# Patient Record
Sex: Female | Born: 1995 | Race: Black or African American | Hispanic: No | Marital: Single | Smoking: Never smoker
Health system: Southern US, Community
[De-identification: ages and names within clinical notes are randomized; demographics above are authoritative.]

## PROBLEM LIST (undated history)

## (undated) DIAGNOSIS — R635 Abnormal weight gain: Secondary | ICD-10-CM

## (undated) DIAGNOSIS — A749 Chlamydial infection, unspecified: Secondary | ICD-10-CM

## (undated) DIAGNOSIS — R87810 Cervical high risk human papillomavirus (HPV) DNA test positive: Secondary | ICD-10-CM

## (undated) DIAGNOSIS — N920 Excessive and frequent menstruation with regular cycle: Secondary | ICD-10-CM

## (undated) DIAGNOSIS — N898 Other specified noninflammatory disorders of vagina: Secondary | ICD-10-CM

## (undated) DIAGNOSIS — Z309 Encounter for contraceptive management, unspecified: Secondary | ICD-10-CM

## (undated) DIAGNOSIS — N760 Acute vaginitis: Secondary | ICD-10-CM

## (undated) DIAGNOSIS — B9689 Other specified bacterial agents as the cause of diseases classified elsewhere: Secondary | ICD-10-CM

## (undated) HISTORY — DX: Acute vaginitis: N76.0

## (undated) HISTORY — DX: Encounter for contraceptive management, unspecified: Z30.9

## (undated) HISTORY — PX: NO PAST SURGERIES: SHX2092

## (undated) HISTORY — DX: Chlamydial infection, unspecified: A74.9

## (undated) HISTORY — DX: Other specified noninflammatory disorders of vagina: N89.8

## (undated) HISTORY — DX: Other specified bacterial agents as the cause of diseases classified elsewhere: B96.89

## (undated) HISTORY — DX: Excessive and frequent menstruation with regular cycle: N92.0

## (undated) HISTORY — DX: Abnormal weight gain: R63.5

---
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
## 2014-07-03 ENCOUNTER — Encounter (HOSPITAL_COMMUNITY): Payer: Self-pay | Admitting: Emergency Medicine

## 2014-07-03 DIAGNOSIS — N939 Abnormal uterine and vaginal bleeding, unspecified: Secondary | ICD-10-CM | POA: Insufficient documentation

## 2014-07-03 DIAGNOSIS — Z3202 Encounter for pregnancy test, result negative: Secondary | ICD-10-CM | POA: Insufficient documentation

## 2014-07-03 DIAGNOSIS — Z791 Long term (current) use of non-steroidal anti-inflammatories (NSAID): Secondary | ICD-10-CM | POA: Insufficient documentation

## 2014-07-03 NOTE — ED Notes (Signed)
Patient states she has been having her menstrual period x 2 months and it hasn't stopped.

## 2014-07-04 ENCOUNTER — Emergency Department (HOSPITAL_COMMUNITY)
Admission: EM | Admit: 2014-07-04 | Discharge: 2014-07-04 | Disposition: A | Discharge status: Home / Self Care | Payer: Medicaid Other | Attending: Emergency Medicine | Admitting: Emergency Medicine

## 2014-07-04 DIAGNOSIS — N939 Abnormal uterine and vaginal bleeding, unspecified: Secondary | ICD-10-CM

## 2014-07-04 LAB — URINALYSIS, ROUTINE W REFLEX MICROSCOPIC
BILIRUBIN URINE: NEGATIVE
Glucose, UA: NEGATIVE mg/dL
Hgb urine dipstick: NEGATIVE
KETONES UR: NEGATIVE mg/dL
LEUKOCYTES UA: NEGATIVE
NITRITE: NEGATIVE
Protein, ur: NEGATIVE mg/dL
UROBILINOGEN UA: 0.2 mg/dL (ref 0.0–1.0)
pH: 5.5 (ref 5.0–8.0)

## 2014-07-04 LAB — PREGNANCY, URINE: Preg Test, Ur: NEGATIVE

## 2014-07-04 MED ORDER — NORGESTIMATE-ETH ESTRADIOL 0.25-35 MG-MCG PO TABS: 1.0000 | ORAL_TABLET | Freq: Every morning | ORAL | Status: DC

## 2014-07-04 NOTE — Discharge Instructions (Signed)
Abnormal Uterine Bleeding Abnormal uterine bleeding can affect women at various stages in life, including teenagers, women in their reproductive years, pregnant women, and women who have reached menopause. Several kinds of uterine bleeding are considered abnormal, including:  Bleeding or spotting between periods.   Bleeding after sexual intercourse.   Bleeding that is heavier or more than normal.   Periods that last longer than usual.  Bleeding after menopause.  Many cases of abnormal uterine bleeding are minor and simple to treat, while others are more serious. Any type of abnormal bleeding should be evaluated by your health care provider. Treatment will depend on the cause of the bleeding. HOME CARE INSTRUCTIONS Monitor your condition for any changes. The following actions may help to alleviate any discomfort you are experiencing:  Avoid the use of tampons and douches as directed by your health care provider.  Change your pads frequently. You should get regular pelvic exams and Pap tests. Keep all follow-up appointments for diagnostic tests as directed by your health care provider.  SEEK MEDICAL CARE IF:   Your bleeding lasts more than 1 week.   You feel dizzy at times.  SEEK IMMEDIATE MEDICAL CARE IF:   You pass out.   You are changing pads every 15 to 30 minutes.   You have abdominal pain.  You have a fever.   You become sweaty or weak.   You are passing large blood clots from the vagina.   You start to feel nauseous and vomit. MAKE SURE YOU:   Understand these instructions.  Will watch your condition.  Will get help right away if you are not doing well or get worse. Document Released: 09/14/2005 Document Revised: 09/19/2013 Document Reviewed: 04/13/2013 [employer] Patient Information 2015 [employer]. This information is not intended to replace advice given to you by your health care provider. Make sure you discuss any questions you have with your  health care provider.  

## 2014-07-04 NOTE — ED Provider Notes (Signed)
CSN: [id number]     Arrival date & time 07/03/14  2331 History   First MD Initiated Contact with Patient 07/04/14 0133     Chief Complaint  Patient presents with  . Menstrual Problem     (Consider location/radiation/quality/duration/timing/severity/associated sxs/prior Treatment) HPI  Stacy Mayo is a 18 y.o. female presents for her daily vaginal bleeding since July 2015. Bleeding is sometimes heavy and sometimes light. She denies fever, chills, nausea, vomiting, weakness, or dizziness. She stopped taking oral contraceptives several months ago. There are no other known modifying factors.   History reviewed. No pertinent past medical history. History reviewed. No pertinent past surgical history. No family history on file. History  Substance Use Topics  . Smoking status: Never Smoker   . Smokeless tobacco: Not on file  . Alcohol Use: No   OB History   Grav Para Term Preterm Abortions TAB SAB Ect Mult Living                 Review of Systems  All other systems reviewed and are negative.     Allergies  Review of patient's allergies indicates no known allergies.  Home Medications   Prior to Admission medications   Medication Sig Start Date End Date Taking? Authorizing Provider  meloxicam (MOBIC) 7.5 MG tablet 1 po bid with food 06/29/12   Kathie DikeHobson M Bryant, PA-C  methocarbamol (ROBAXIN) 500 MG tablet 1 or 2 po tid for spasm/pain 06/29/12   Kathie DikeHobson M Bryant, PA-C  norgestimate-ethinyl estradiol (SPRINTEC 28) 0.25-35 MG-MCG tablet Take 1 tablet by mouth every morning.    Historical Provider, MD   BP 100/72  Pulse 64  Temp(Src) 98.2 F (36.8 C) (Oral)  Resp 18  Ht 5\' 3"  (1.6 m)  Wt 152 lb (68.947 kg)  BMI 26.93 kg/m2  SpO2 100%  LMP 05/03/2014 Physical Exam  Nursing note and vitals reviewed. Constitutional: She is oriented to person, place, and time. She appears well-developed and well-nourished.  HENT:  Head: Normocephalic and atraumatic.  Eyes: Conjunctivae and  EOM are normal. Pupils are equal, round, and reactive to light.  Neck: Normal range of motion and phonation normal. Neck supple.  Cardiovascular: Normal rate and regular rhythm.   Pulmonary/Chest: Effort normal and breath sounds normal. She exhibits no tenderness.  Abdominal: Soft. She exhibits no distension. There is no tenderness. There is no guarding.  Musculoskeletal: Normal range of motion.  Neurological: She is alert and oriented to person, place, and time. She exhibits normal muscle tone.  Skin: Skin is warm and dry.  Psychiatric: She has a normal mood and affect. Her behavior is normal. Judgment and thought content normal.    ED Course  Procedures (including critical care time)   Findings discussed with the patient, and mother, all questions answered.  Labs Review Labs Reviewed  URINALYSIS, ROUTINE W REFLEX MICROSCOPIC - Abnormal; Notable for the following:    Specific Gravity, Urine >1.030 (*)    All other components within normal limits  PREGNANCY, URINE    Imaging Review No results found.   EKG Interpretation None      MDM   Final diagnoses:  Abnormal vaginal bleeding    Persistent, vaginal bleeding for several months without signs and symptoms of hypovolemia. No apparent infection, or pregnancy.  Nursing Notes Reviewed/ Care Coordinated Applicable Imaging Reviewed Interpretation of Laboratory Data incorporated into ED treatment  The patient appears reasonably screened and/or stabilized for discharge and I doubt any other medical condition or other [facility] requiring further  screening, evaluation, or treatment in the ED at this time prior to discharge.  Plan: Home Medications- restart OC; Home Treatments- rest; return here if the recommended treatment, does not improve the symptoms; Recommended follow up- GYN 3 weeks for check up    Flint Melter, MD 07/04/14 0206

## 2014-08-06 ENCOUNTER — Ambulatory Visit: Payer: Self-pay | Admitting: Obstetrics and Gynecology

## 2014-08-15 ENCOUNTER — Ambulatory Visit: Payer: Self-pay | Admitting: Obstetrics and Gynecology

## 2014-08-17 ENCOUNTER — Encounter: Payer: Self-pay | Admitting: Obstetrics and Gynecology

## 2014-08-17 ENCOUNTER — Ambulatory Visit: Payer: Self-pay | Admitting: Obstetrics and Gynecology

## 2014-10-11 ENCOUNTER — Encounter: Payer: Self-pay | Admitting: Adult Health

## 2014-10-11 ENCOUNTER — Ambulatory Visit (INDEPENDENT_AMBULATORY_CARE_PROVIDER_SITE_OTHER): Payer: BLUE CROSS/BLUE SHIELD | Admitting: Adult Health

## 2014-10-11 VITALS — BP 100/68 | Ht 65.5 in | Wt 178.0 lb

## 2014-10-11 DIAGNOSIS — A499 Bacterial infection, unspecified: Secondary | ICD-10-CM

## 2014-10-11 DIAGNOSIS — N920 Excessive and frequent menstruation with regular cycle: Secondary | ICD-10-CM

## 2014-10-11 DIAGNOSIS — R635 Abnormal weight gain: Secondary | ICD-10-CM | POA: Insufficient documentation

## 2014-10-11 DIAGNOSIS — B9689 Other specified bacterial agents as the cause of diseases classified elsewhere: Secondary | ICD-10-CM

## 2014-10-11 DIAGNOSIS — N760 Acute vaginitis: Secondary | ICD-10-CM | POA: Insufficient documentation

## 2014-10-11 DIAGNOSIS — Z309 Encounter for contraceptive management, unspecified: Secondary | ICD-10-CM | POA: Insufficient documentation

## 2014-10-11 DIAGNOSIS — Z3040 Encounter for surveillance of contraceptives, unspecified: Secondary | ICD-10-CM

## 2014-10-11 DIAGNOSIS — N898 Other specified noninflammatory disorders of vagina: Secondary | ICD-10-CM

## 2014-10-11 HISTORY — DX: Other specified noninflammatory disorders of vagina: N89.8

## 2014-10-11 HISTORY — DX: Excessive and frequent menstruation with regular cycle: N92.0

## 2014-10-11 HISTORY — DX: Other specified bacterial agents as the cause of diseases classified elsewhere: B96.89

## 2014-10-11 HISTORY — DX: Abnormal weight gain: R63.5

## 2014-10-11 HISTORY — DX: Encounter for contraceptive management, unspecified: Z30.9

## 2014-10-11 LAB — POCT WET PREP (WET MOUNT): WBC WET PREP: POSITIVE

## 2014-10-11 LAB — COMPREHENSIVE METABOLIC PANEL
ALBUMIN: 4.1 g/dL (ref 3.5–5.2)
ALK PHOS: 40 U/L (ref 39–117)
ALT: <8 U/L (ref 0–35)
AST: 14 U/L (ref 0–37)
BUN: 11 mg/dL (ref 6–23)
CO2: 22 mEq/L (ref 19–32)
Calcium: 9.2 mg/dL (ref 8.4–10.5)
Chloride: 106 mEq/L (ref 96–112)
Creat: 0.66 mg/dL (ref 0.50–1.10)
Glucose, Bld: 71 mg/dL (ref 70–99)
POTASSIUM: 4 meq/L (ref 3.5–5.3)
Sodium: 139 mEq/L (ref 135–145)
TOTAL PROTEIN: 7.4 g/dL (ref 6.0–8.3)
Total Bilirubin: 0.3 mg/dL (ref 0.2–1.2)

## 2014-10-11 LAB — CBC
HCT: 36.2 % (ref 36.0–46.0)
Hemoglobin: 12.1 g/dL (ref 12.0–15.0)
MCH: 29.4 pg (ref 26.0–34.0)
MCHC: 33.4 g/dL (ref 30.0–36.0)
MCV: 88.1 fL (ref 78.0–100.0)
MPV: 11 fL (ref 8.6–12.4)
PLATELETS: 292 10*3/uL (ref 150–400)
RBC: 4.11 MIL/uL (ref 3.87–5.11)
RDW: 13.5 % (ref 11.5–15.5)
WBC: 4.5 10*3/uL (ref 4.0–10.5)

## 2014-10-11 MED ORDER — METRONIDAZOLE 500 MG PO TABS: 500.0000 mg | ORAL_TABLET | Freq: Two times a day (BID) | ORAL | Status: DC

## 2014-10-11 NOTE — Progress Notes (Signed)
Subjective:     Patient ID: Stacy Mayo, female   DOB: 1996/07/30, 19 y.o.   MRN: [id number]  HPI Stacy Mayo is a 19 year old black female in complaining of vaginal discharge with odor and heavy periods even with sprintec and weight gain.Periods last 7 days and heavy the whole time.Cramps are better with the pill.Has gained about 25 lbs since October.Periods regular, no dry skin or hair and nail changes.Saw Dr Emelda FearFerguson about 2 years ago.No complaints with sex.  Review of Systems See HPI Reviewed past medical,surgical, social and family history. Reviewed medications and allergies.     Objective:   Physical Exam BP 100/68 mmHg  Ht 5' 5.5" (1.664 m)  Wt 178 lb (80.74 kg)  BMI 29.16 kg/m2  LMP 09/27/2014   Skin warm and dry.Pelvic: external genitalia is normal in appearance, vagina: white discharge with odor, cervix:smooth and bulbous, uterus: normal size, shape and contour, non tender, no masses felt, adnexa: no masses or tenderness noted. Wet prep: + for clue cells and +WBCs. GC/CHL obtained. Thyroid feels WNL.  Assessment:    Menorrhagia Vaginal discharge BV Contraceptive management Weight gain     Plan:    Continue sprintec Check GC/CHL and CBC,CMP and TSH Return in 1 week for US and see me   Rx flagyl 500 mg 1 bid x 7 days, no alcohol, review handout on BV   Review handout on menorrhagia

## 2014-10-11 NOTE — Patient Instructions (Signed)
Menorrhagia Menorrhagia is the medical term for when your menstrual periods are heavy or last longer than usual. With menorrhagia, every period you have may cause enough blood loss and cramping that you are unable to maintain your usual activities. CAUSES  In some cases, the cause of heavy periods is unknown, but a number of conditions may cause menorrhagia. Common causes include:  A problem with the hormone-producing thyroid gland (hypothyroid).  Noncancerous growths in the uterus (polyps or fibroids).  An imbalance of the estrogen and progesterone hormones.  One of your ovaries not releasing an egg during one or more months.  Side effects of having an intrauterine device (IUD).  Side effects of some medicines, such as anti-inflammatory medicines or blood thinners.  A bleeding disorder that stops your blood from clotting normally. SIGNS AND SYMPTOMS  During a normal period, bleeding lasts between 4 and 8 days. Signs that your periods are too heavy include:  You routinely have to change your pad or tampon every 1 or 2 hours because it is completely soaked.  You pass blood clots larger than 1 inch (2.5 cm) in size.  You have bleeding for more than 7 days.  You need to use pads and tampons at the same time because of heavy bleeding.  You need to wake up to change your pads or tampons during the night.  You have symptoms of anemia, such as tiredness, fatigue, or shortness of breath. DIAGNOSIS  Your health care provider will perform a physical exam and ask you questions about your symptoms and menstrual history. Other tests may be ordered based on what the health care provider finds during the exam. These tests can include:  Blood tests. Blood tests are used to check if you are pregnant or have hormonal changes, a bleeding or thyroid disorder, low iron levels (anemia), or other problems.  Endometrial biopsy. Your health care provider takes a sample of tissue from the inside of your  uterus to be examined under a microscope.  Pelvic ultrasound. This test uses sound waves to make a picture of your uterus, ovaries, and vagina. The pictures can show if you have fibroids or other growths.  Hysteroscopy. For this test, your health care provider will use a small telescope to look inside your uterus. Based on the results of your initial tests, your health care provider may recommend further testing. TREATMENT  Treatment may not be needed. If it is needed, your health care provider may recommend treatment with one or more medicines first. If these do not reduce bleeding enough, a surgical treatment might be an option. The best treatment for you will depend on:   Whether you need to prevent pregnancy.  Your desire to have children in the future.  The cause and severity of your bleeding.  Your opinion and personal preference.  Medicines for menorrhagia may include:  Birth control methods that use hormones. These include the pill, skin patch, vaginal ring, shots that you get every 3 months, hormonal IUD, and implant. These treatments reduce bleeding during your menstrual period.  Medicines that thicken blood and slow bleeding.  Medicines that reduce swelling, such as ibuprofen.  Medicines that contain a synthetic hormone called progestin.   Medicines that make the ovaries stop working for a short time.  You may need surgical treatment for menorrhagia if the medicines are unsuccessful. Treatment options include:  Dilation and curettage (D&C). In this procedure, your health care provider opens (dilates) your cervix and then scrapes or suctions tissue from   the lining of your uterus to reduce menstrual bleeding.  Operative hysteroscopy. This procedure uses a tiny tube with a light (hysteroscope) to view your uterine cavity and can help in the surgical removal of a polyp that may be causing heavy periods.  Endometrial ablation. Through various techniques, your health care  provider permanently destroys the entire lining of your uterus (endometrium). After endometrial ablation, most women have little or no menstrual flow. Endometrial ablation reduces your ability to become pregnant.  Endometrial resection. This surgical procedure uses an electrosurgical wire loop to remove the lining of the uterus. This procedure also reduces your ability to become pregnant.  Hysterectomy. Surgical removal of the uterus and cervix is a permanent procedure that stops menstrual periods. Pregnancy is not possible after a hysterectomy. This procedure requires anesthesia and hospitalization. HOME CARE INSTRUCTIONS   Only take over-the-counter or prescription medicines as directed by your health care provider. Take prescribed medicines exactly as directed. Do not change or switch medicines without consulting your health care provider.  Take any prescribed iron pills exactly as directed by your health care provider. Long-term heavy bleeding may result in low iron levels. Iron pills help replace the iron your body lost from heavy bleeding. Iron may cause constipation. If this becomes a problem, increase the bran, fruits, and roughage in your diet.  Do not take aspirin or medicines that contain aspirin 1 week before or during your menstrual period. Aspirin may make the bleeding worse.  If you need to change your sanitary pad or tampon more than once every 2 hours, stay in bed and rest as much as possible until the bleeding stops.  Eat well-balanced meals. Eat foods high in iron. Examples are leafy green vegetables, meat, liver, eggs, and whole grain breads and cereals. Do not try to lose weight until the abnormal bleeding has stopped and your blood iron level is back to normal. SEEK MEDICAL CARE IF:   You soak through a pad or tampon every 1 or 2 hours, and this happens every time you have a period.  You need to use pads and tampons at the same time because you are bleeding so much.  You  need to change your pad or tampon during the night.  You have a period that lasts for more than 8 days.  You pass clots bigger than 1 inch wide.  You have irregular periods that happen more or less often than once a month.  You feel dizzy or faint.  You feel very weak or tired.  You feel short of breath or feel your heart is beating too fast when you exercise.  You have nausea and vomiting or diarrhea while you are taking your medicine.  You have any problems that may be related to the medicine you are taking. SEEK IMMEDIATE MEDICAL CARE IF:   You soak through 4 or more pads or tampons in 2 hours.  You have any bleeding while you are pregnant. MAKE SURE YOU:   Understand these instructions.  Will watch your condition.  Will get help right away if you are not doing well or get worse. Document Released: 09/14/2005 Document Revised: 09/19/2013 Document Reviewed: 03/05/2013 [facility] Patient Information 2015 [place], [place]. This information is not intended to replace advice given to you by your health care provider. Make sure you discuss any questions you have with your health care provider. Bacterial Vaginosis Bacterial vaginosis is a vaginal infection that occurs when the normal balance of bacteria in the vagina is disrupted.  It results from an overgrowth of certain bacteria. This is the most common vaginal infection in women of childbearing age. Treatment is important to prevent complications, especially in pregnant women, as it can cause a premature delivery. CAUSES  Bacterial vaginosis is caused by an increase in harmful bacteria that are normally present in smaller amounts in the vagina. Several different kinds of bacteria can cause bacterial vaginosis. However, the reason that the condition develops is not fully understood. RISK FACTORS Certain activities or behaviors can put you at an increased risk of developing bacterial vaginosis, including:  Having a new sex partner or  multiple sex partners.  Douching.  Using an intrauterine device (IUD) for contraception. Women do not get bacterial vaginosis from toilet seats, bedding, swimming pools, or contact with objects around them. SIGNS AND SYMPTOMS  Some women with bacterial vaginosis have no signs or symptoms. Common symptoms include:  Grey vaginal discharge.  A fishlike odor with discharge, especially after sexual intercourse.  Itching or burning of the vagina and vulva.  Burning or pain with urination. DIAGNOSIS  Your health care provider will take a medical history and examine the vagina for signs of bacterial vaginosis. A sample of vaginal fluid may be taken. Your health care provider will look at this sample under a microscope to check for bacteria and abnormal cells. A vaginal pH test may also be done.  TREATMENT  Bacterial vaginosis may be treated with antibiotic medicines. These may be given in the form of a pill or a vaginal cream. A second round of antibiotics may be prescribed if the condition comes back after treatment.  HOME CARE INSTRUCTIONS   Only take over-the-counter or prescription medicines as directed by your health care provider.  If antibiotic medicine was prescribed, take it as directed. Make sure you finish it even if you start to feel better.  Do not have sex until treatment is completed.  Tell all sexual partners that you have a vaginal infection. They should see their health care provider and be treated if they have problems, such as a mild rash or itching.  Practice safe sex by using condoms and only having one sex partner. SEEK MEDICAL CARE IF:   Your symptoms are not improving after 3 days of treatment.  You have increased discharge or pain.  You have a fever. MAKE SURE YOU:   Understand these instructions.  Will watch your condition.  Will get help right away if you are not doing well or get worse. FOR MORE INFORMATION  Centers for Disease Control and  Prevention, Division of STD Prevention: SolutionApps.co.zawww.cdc.gov/std American Sexual Health Association (ASHA): www.ashastd.org  Document Released: 09/14/2005 Document Revised: 07/05/2013 Document Reviewed: 04/26/2013 [facility] Patient Information 2015 [place]. This information is not intended to replace advice given to you by your health care provider. Make sure you discuss any questions you have with your health care provider. Return in 1 week for [place]

## 2014-10-12 LAB — GC/CHLAMYDIA PROBE AMP
CT Probe RNA: NEGATIVE
GC Probe RNA: NEGATIVE

## 2014-10-12 LAB — TSH: TSH: 1.389 u[IU]/mL (ref 0.350–4.500)

## 2014-10-15 ENCOUNTER — Telehealth: Payer: Self-pay | Admitting: Adult Health

## 2014-10-15 NOTE — Telephone Encounter (Signed)
Pt aware labs good and GC/CHL negative, will see 1/25 for [place]

## 2014-10-22 ENCOUNTER — Ambulatory Visit (INDEPENDENT_AMBULATORY_CARE_PROVIDER_SITE_OTHER): Payer: BLUE CROSS/BLUE SHIELD | Admitting: Adult Health

## 2014-10-22 ENCOUNTER — Ambulatory Visit (INDEPENDENT_AMBULATORY_CARE_PROVIDER_SITE_OTHER): Payer: BLUE CROSS/BLUE SHIELD

## 2014-10-22 ENCOUNTER — Other Ambulatory Visit: Payer: Self-pay | Admitting: Adult Health

## 2014-10-22 ENCOUNTER — Encounter: Payer: Self-pay | Admitting: Adult Health

## 2014-10-22 VITALS — BP 118/80 | Ht 65.0 in | Wt 178.0 lb

## 2014-10-22 DIAGNOSIS — N920 Excessive and frequent menstruation with regular cycle: Secondary | ICD-10-CM

## 2014-10-22 NOTE — Patient Instructions (Signed)
Continue OCs  Return in May for pap and physical

## 2014-10-22 NOTE — Progress Notes (Signed)
Subjective:     Patient ID: Stacy Mayo, female   DOB: 13-Apr-1996, 19 y.o.   MRN: [id number]  HPI Stacy Mayo is a 19 year old black female in for US.  Review of Systems See HPI Reviewed past medical,surgical, social and family history. Reviewed medications and allergies.     Objective:   Physical Exam BP 118/80 mmHg  Ht 5\' 5"  (1.651 m)  Wt 178 lb (80.74 kg)  BMI 29.62 kg/m2  LMP 12/31/2015Reviewed US with pt.   Uterus 6.4 x 5.3 x 4.2 cm, anteverted  Endometrium 4.0 mm, symmetrical,   Right ovary 3.8 x 2.2 x 1.9 cm,   Left ovary 4.2 x 2.7 x 1.3 cm,   Trace to a Small amount of free fluid noted within the posterior cul-de-sac  Technician Comments:  Anteverted uterus, Endom-4.610mm symmetrical, bilateral adnexa appears WNL, small amount of free fluid noted within the posterior cul-de-sac Will continue sprintec for now,as cramps are better and periods regular.  Assessment:     Menorrhagia     Plan:     Continue OCs Return in May for pap and physical

## 2014-10-27 ENCOUNTER — Encounter (HOSPITAL_COMMUNITY): Payer: Self-pay | Admitting: *Deleted

## 2014-10-27 ENCOUNTER — Emergency Department (HOSPITAL_COMMUNITY)
Admission: EM | Admit: 2014-10-27 | Discharge: 2014-10-27 | Disposition: A | Discharge status: Home / Self Care | Payer: BLUE CROSS/BLUE SHIELD | Attending: Emergency Medicine | Admitting: Emergency Medicine

## 2014-10-27 ENCOUNTER — Emergency Department (HOSPITAL_COMMUNITY): Payer: BLUE CROSS/BLUE SHIELD

## 2014-10-27 DIAGNOSIS — W000XXA Fall on same level due to ice and snow, initial encounter: Secondary | ICD-10-CM | POA: Insufficient documentation

## 2014-10-27 DIAGNOSIS — Z793 Long term (current) use of hormonal contraceptives: Secondary | ICD-10-CM | POA: Insufficient documentation

## 2014-10-27 DIAGNOSIS — W1839XA Other fall on same level, initial encounter: Secondary | ICD-10-CM | POA: Diagnosis not present

## 2014-10-27 DIAGNOSIS — N920 Excessive and frequent menstruation with regular cycle: Secondary | ICD-10-CM | POA: Diagnosis not present

## 2014-10-27 DIAGNOSIS — Y998 Other external cause status: Secondary | ICD-10-CM | POA: Diagnosis not present

## 2014-10-27 DIAGNOSIS — S99922A Unspecified injury of left foot, initial encounter: Secondary | ICD-10-CM | POA: Diagnosis present

## 2014-10-27 DIAGNOSIS — S93502A Unspecified sprain of left great toe, initial encounter: Secondary | ICD-10-CM | POA: Insufficient documentation

## 2014-10-27 DIAGNOSIS — S93509A Unspecified sprain of unspecified toe(s), initial encounter: Secondary | ICD-10-CM

## 2014-10-27 DIAGNOSIS — Y9389 Activity, other specified: Secondary | ICD-10-CM | POA: Insufficient documentation

## 2014-10-27 DIAGNOSIS — Y9289 Other specified places as the place of occurrence of the external cause: Secondary | ICD-10-CM | POA: Diagnosis not present

## 2014-10-27 MED ORDER — IBUPROFEN 800 MG PO TABS
800.0000 mg | ORAL_TABLET | Freq: Once | ORAL | Status: AC
  Administered 2014-10-27: 800 mg via ORAL
  Filled 2014-10-27: qty 1

## 2014-10-27 MED ORDER — HYDROCODONE-ACETAMINOPHEN 5-325 MG PO TABS: 1.0000 | ORAL_TABLET | ORAL | Status: DC | PRN

## 2014-10-27 MED ORDER — HYDROCODONE-ACETAMINOPHEN 5-325 MG PO TABS
1.0000 | ORAL_TABLET | Freq: Once | ORAL | Status: DC
  Filled 2014-10-27: qty 1

## 2014-10-27 MED ORDER — IBUPROFEN 800 MG PO TABS: 800.0000 mg | ORAL_TABLET | Freq: Three times a day (TID) | ORAL | Status: DC

## 2014-10-27 NOTE — Discharge Instructions (Signed)
Joint Sprain °A sprain is a tear or stretch in the ligaments that hold a joint together. Severe sprains may need as long as 3-6 weeks of immobilization and/or exercises to heal completely. Sprained joints should be rested and protected. If not, they can become unstable and prone to re-injury. Proper treatment can reduce your pain, shorten the period of disability, and reduce the risk of repeated injuries. °TREATMENT  °· Rest and elevate the injured joint to reduce pain and swelling. °· Apply ice packs to the injury for 20-30 minutes every 2-3 hours for the next 2-3 days. °· Keep the injury wrapped in a compression bandage or splint as long as the joint is painful or as instructed by your caregiver. °· Do not use the injured joint until it is completely healed to prevent re-injury and chronic instability. Follow the instructions of your caregiver. °· Long-term sprain management may require exercises and/or treatment by a physical therapist. Taping or special braces may help stabilize the joint until it is completely better. °SEEK MEDICAL CARE IF:  °· You develop increased pain or swelling of the joint. °· You develop increasing redness and warmth of the joint. °· You develop a fever. °· It becomes stiff. °· Your hand or foot gets cold or numb. °Document Released: 10/22/2004 Document Revised: 12/07/2011 Document Reviewed: 10/01/2008 °ExitCare® Patient Information ©2015 [employer]. This information is not intended to replace advice given to you by your health care provider. Make sure you discuss any questions you have with your health care provider. ° °

## 2014-10-27 NOTE — ED Provider Notes (Signed)
CSN: [id number]     Arrival date & time 10/27/14  1910 History   First MD Initiated Contact with Patient 10/27/14 2001     Chief Complaint  Patient presents with  . Foot Pain     (Consider location/radiation/quality/duration/timing/severity/associated sxs/prior Treatment) HPI Comments: Patient is a 19 year old female who states that on Thursday, January 28 she sustained a fall on icy surface and injured her left foot. She has been having pain since that time. She has swelling present. She is able to ambulate but with a great nail pain. His been no previous operations or procedures involving the left foot. The pain is worse with standing or walking. The pain is better with rest.  Patient is a 19 y.o. female presenting with lower extremity pain. The history is provided by the patient.  Foot Pain This is a new problem. Pertinent negatives include no abdominal pain, arthralgias, chest pain, coughing or neck pain.    Past Medical History  Diagnosis Date  . Menorrhagia with regular cycle 10/11/2014  . Vaginal discharge 10/11/2014  . BV (bacterial vaginosis) 10/11/2014  . Contraceptive management 10/11/2014  . Weight gain 10/11/2014   Past Surgical History  Procedure Laterality Date  . No past surgeries     Family History  Problem Relation Age of Onset  . Hypertension Maternal Grandmother   . Diabetes Maternal Grandfather    History  Substance Use Topics  . Smoking status: Never Smoker   . Smokeless tobacco: Never Used  . Alcohol Use: No   OB History    Gravida Para Term Preterm AB TAB SAB Ectopic Multiple Living       Review of Systems  Constitutional: Negative for activity change.       All ROS Neg except as noted in HPI  HENT: Negative.   Eyes: Negative for photophobia and discharge.  Respiratory: Negative for cough, shortness of breath and wheezing.   Cardiovascular: Negative for chest pain and palpitations.  Gastrointestinal: Negative for abdominal  pain and blood in stool.  Genitourinary: Negative for dysuria, frequency and hematuria.  Musculoskeletal: Negative for back pain, arthralgias and neck pain.  Skin: Negative.   Neurological: Negative for dizziness, seizures and speech difficulty.  Psychiatric/Behavioral: Negative for hallucinations and confusion.      Allergies  Review of patient's allergies indicates no known allergies.  Home Medications   Prior to Admission medications   Medication Sig Start Date End Date Taking? Authorizing Provider  metroNIDAZOLE (FLAGYL) 500 MG tablet Take 1 tablet (500 mg total) by mouth 2 (two) times daily. Patient not taking: Reported on 10/22/2014 10/11/14   Adline Potter, NP  norgestimate-ethinyl estradiol (SPRINTEC 28) 0.25-35 MG-MCG tablet Take 1 tablet by mouth every morning. 07/04/14   Flint Melter, MD   BP 118/70 mmHg  Pulse 82  Temp(Src) 98.7 F (37.1 C) (Oral)  Resp 18  Ht  (1.651 m)  Wt 178 lb (80.74 kg)  BMI 29.62 kg/m2  SpO2 100%  LMP 09/27/2014 Physical Exam  Constitutional: She is oriented to person, place, and time. She appears well-developed and well-nourished.  Non-toxic appearance.  HENT:  Head: Normocephalic.  Right Ear: Tympanic membrane and external ear normal.  Left Ear: Tympanic membrane and external ear normal.  Eyes: EOM and lids are normal. Pupils are equal, round, and reactive to light.  Neck: Normal range of motion. Neck supple. Carotid bruit is not present.  Cardiovascular: Normal rate, regular rhythm,  normal heart sounds, intact distal pulses and normal pulses.   Pulmonary/Chest: Breath sounds normal. No respiratory distress.  Abdominal: Soft. Bowel sounds are normal. There is no tenderness. There is no guarding.  Musculoskeletal:       Left foot: There is decreased range of motion and tenderness. There is normal capillary refill.       Feet:  Lymphadenopathy:       Head (right side): No submandibular adenopathy present.       Head (left  side): No submandibular adenopathy present.    She has no cervical adenopathy.  Neurological: She is alert and oriented to person, place, and time. She has normal strength. No cranial nerve deficit or sensory deficit.  Skin: Skin is warm and dry.  Psychiatric: She has a normal mood and affect. Her speech is normal.  Nursing note and vitals reviewed.   ED Course  Procedures (including critical care time) Labs Review Labs Reviewed - No data to display  Imaging Review Dg Foot Complete Left  10/27/2014   CLINICAL DATA:  Larey SeatFell on ice today. Pain and bruising at medial side of foot.  EXAM: LEFT FOOT - COMPLETE 3+ VIEW  COMPARISON:  None.  FINDINGS: Negative for acute fracture or dislocation. There is soft tissue swelling at the medial aspect of the first metatarsal head and there are a few flecks of calcification or foreign material in the soft tissues. No bony abnormality is evident. There is mild hallux valgus.  IMPRESSION: Negative for acute fracture or dislocation. Question a few soft tissue calcifications or foreign material at the medial aspect of the metatarsal head. The appearance of the prominent soft tissue with a few calcifications raises the question of gout although that would be unusual in a patient of this age.   Electronically Signed   By: Ellery Plunkaniel R Mitchell M.D.   On: 10/27/2014 20:02     EKG Interpretation None      MDM  Vital signs within normal limits. There is no evidence of neurovascular compromise. X-ray of the left foot questions soft tissue swelling and a few calcific areas within the soft tissue near the first MP joint. There is no fracture or dislocation present.  The patient will be treated with ibuprofen and Norco. Postop shoe was offered to the patient, but she declines at this time. Patient is advised to see the podiatry specialist for further evaluation of the swelling near the MP joint as well as the calcified areas present.    Final diagnoses:  None    *I  have reviewed nursing notes, vital signs, and all appropriate lab and imaging results for this patient.[address]**    Patrick Sohm Garry HeaterM Dejana Pugsley, PA-C 10/27/14 2034  Glynn OctaveStephen Rancour, MD 10/28/14 [phone]

## 2014-10-27 NOTE — ED Notes (Signed)
Pt states that she is driving home and explained why to pt for not giving Vicodin, EDPA also made aware

## 2014-10-27 NOTE — ED Notes (Signed)
Pt verbalized understanding of no driving and to use caution within 4 hours of taking pain meds due to meds cause drowsiness 

## 2014-10-27 NOTE — ED Notes (Signed)
Pt c/o left foot pain since Thursday.

## 2014-10-31 ENCOUNTER — Telehealth: Payer: Self-pay | Admitting: Adult Health

## 2014-10-31 MED ORDER — NORGESTIMATE-ETH ESTRADIOL 0.25-35 MG-MCG PO TABS: 1.0000 | ORAL_TABLET | Freq: Every morning | ORAL | Status: DC

## 2014-10-31 NOTE — Telephone Encounter (Signed)
Spoke with pt. Pt is needing  refills on her Sprintec. Thanks!! JSY

## 2014-10-31 NOTE — Telephone Encounter (Signed)
Will refill sprintec  

## 2015-02-21 ENCOUNTER — Other Ambulatory Visit: Payer: BLUE CROSS/BLUE SHIELD | Admitting: Adult Health

## 2015-05-13 ENCOUNTER — Other Ambulatory Visit: Payer: Self-pay | Admitting: Adult Health

## 2016-06-12 IMAGING — CR DG FOOT COMPLETE 3+V*L*
3 series · 3 of 3 positions shown · non-contrast
Comparison: None.

CLINICAL DATA: Fell on ice today. Pain and bruising at medial side
of foot.

EXAM:
LEFT FOOT - COMPLETE 3+ VIEW

[view not recorded (1 of 3)]
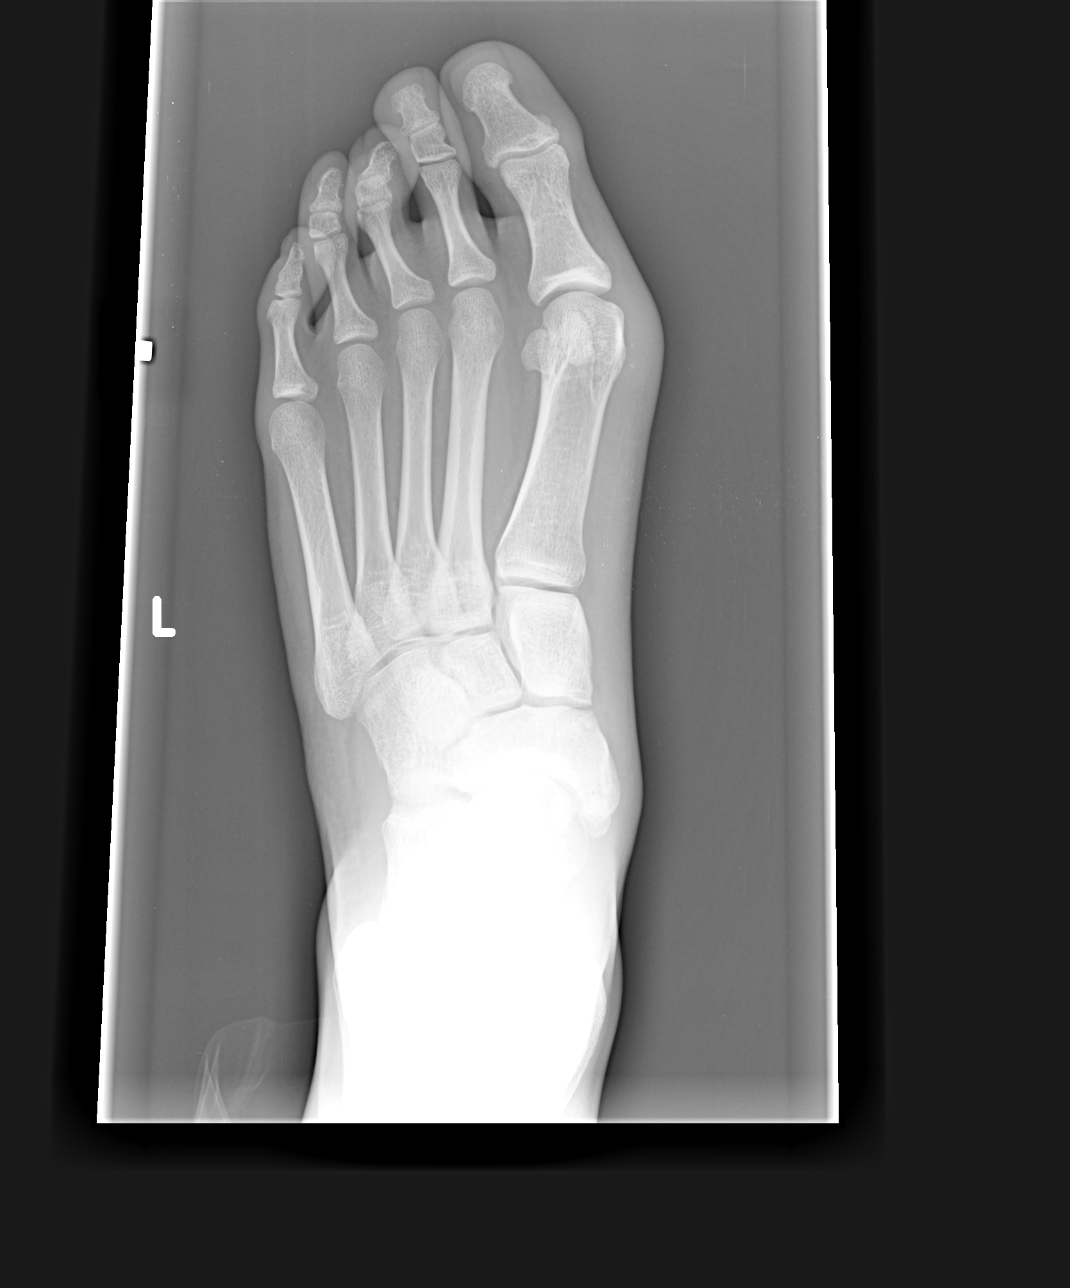

[view not recorded (2 of 3)]
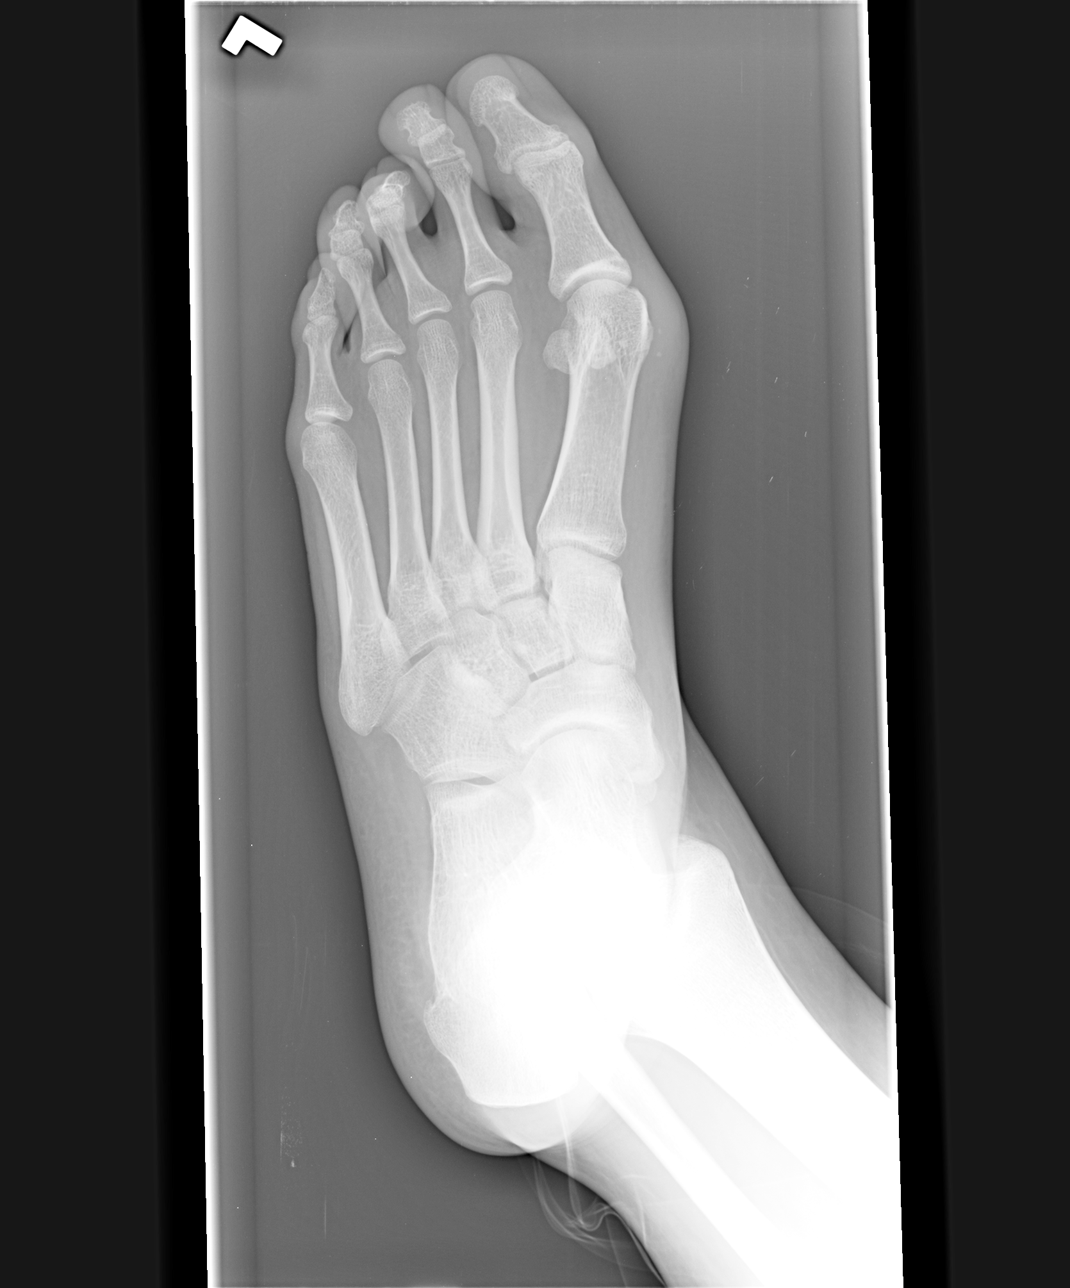

[view not recorded (3 of 3)]
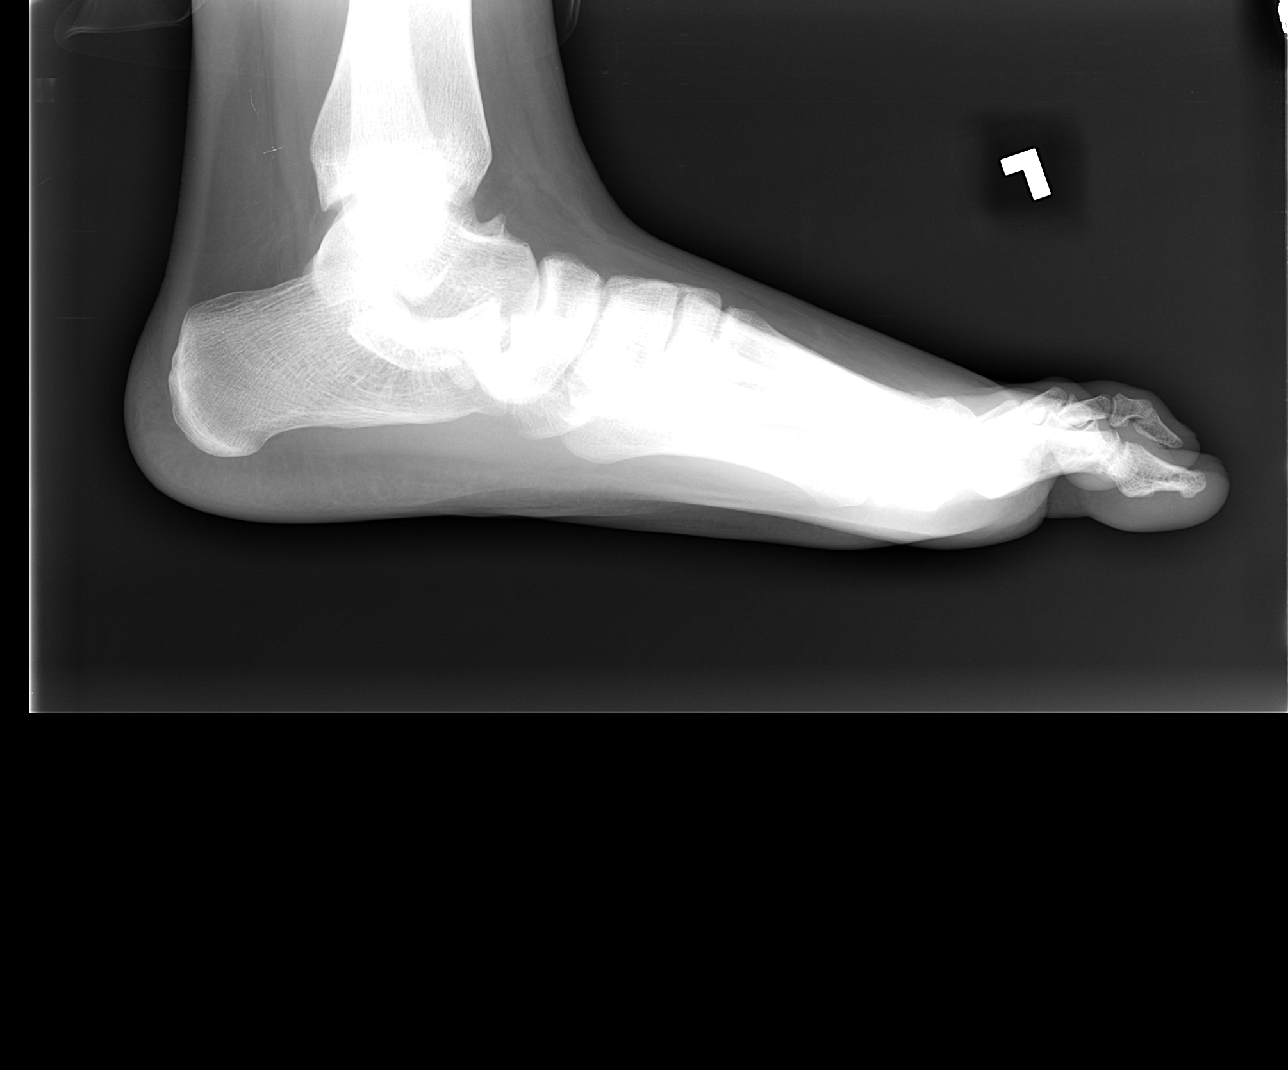

[3 of 3 positions shown; findings below may reference images not displayed]

FINDINGS: Negative for acute fracture or dislocation. There is soft tissue
swelling at the medial aspect of the first metatarsal head and there
are a few flecks of calcification or foreign material in the soft
tissues. No bony abnormality is evident. There is mild hallux
valgus.
IMPRESSION: Negative for acute fracture or dislocation. Question a few soft
tissue calcifications or foreign material at the medial aspect of
the metatarsal head. The appearance of the prominent soft tissue
with a few calcifications raises the question of gout although that
would be unusual in a patient of this age.

## 2016-07-31 ENCOUNTER — Other Ambulatory Visit: Payer: Self-pay | Admitting: Adult Health

## 2016-08-05 ENCOUNTER — Other Ambulatory Visit: Payer: Self-pay | Admitting: Adult Health

## 2016-08-07 ENCOUNTER — Telehealth: Payer: Self-pay | Admitting: Adult Health

## 2016-08-07 NOTE — Telephone Encounter (Signed)
Left message telling pt she needs an appt. In Aug 2016, med was refilled and pt was advised needs appt. For physical. Advised to call and schedule an appt and refills will be added then. JSY

## 2016-08-19 ENCOUNTER — Other Ambulatory Visit: Payer: Self-pay | Admitting: Adult Health

## 2016-09-01 ENCOUNTER — Other Ambulatory Visit (HOSPITAL_COMMUNITY)
Admission: RE | Admit: 2016-09-01 | Discharge: 2016-09-01 | Disposition: A | Discharge status: Home / Self Care | Payer: BLUE CROSS/BLUE SHIELD | Source: Ambulatory Visit | Attending: Adult Health | Admitting: Adult Health

## 2016-09-01 ENCOUNTER — Encounter: Payer: Self-pay | Admitting: Adult Health

## 2016-09-01 ENCOUNTER — Ambulatory Visit (INDEPENDENT_AMBULATORY_CARE_PROVIDER_SITE_OTHER): Payer: BLUE CROSS/BLUE SHIELD | Admitting: Adult Health

## 2016-09-01 VITALS — BP 100/70 | HR 82 | Ht 64.5 in | Wt 182.0 lb

## 2016-09-01 DIAGNOSIS — Z01419 Encounter for gynecological examination (general) (routine) without abnormal findings: Secondary | ICD-10-CM | POA: Insufficient documentation

## 2016-09-01 DIAGNOSIS — Z30011 Encounter for initial prescription of contraceptive pills: Secondary | ICD-10-CM

## 2016-09-01 DIAGNOSIS — N920 Excessive and frequent menstruation with regular cycle: Secondary | ICD-10-CM | POA: Diagnosis not present

## 2016-09-01 DIAGNOSIS — Z01411 Encounter for gynecological examination (general) (routine) with abnormal findings: Secondary | ICD-10-CM

## 2016-09-01 DIAGNOSIS — Z3202 Encounter for pregnancy test, result negative: Secondary | ICD-10-CM | POA: Diagnosis not present

## 2016-09-01 DIAGNOSIS — Z113 Encounter for screening for infections with a predominantly sexual mode of transmission: Secondary | ICD-10-CM | POA: Insufficient documentation

## 2016-09-01 LAB — POCT URINE PREGNANCY: PREG TEST UR: NEGATIVE

## 2016-09-01 MED ORDER — NORGESTIMATE-ETH ESTRADIOL 0.25-35 MG-MCG PO TABS: 1.0000 | ORAL_TABLET | Freq: Every day | ORAL | 11 refills | Status: DC

## 2016-09-01 NOTE — Patient Instructions (Signed)
Physical in 1 year Pap in 3 if normal Start sprintec today Use condoms  Get family planning medicaid

## 2016-09-01 NOTE — Progress Notes (Signed)
Patient ID: Stacy Mayo, female   DOB: 1996/07/22, 20 y.o.   MRN: [id number] History of Present Illness: Stacy Mayo is a 20 year old black female in for her first pap and gyn exam.    Current Medications, Allergies, Past Medical History, Past Surgical History, Family History and Social History were reviewed in [facility] electronic medical record.     Review of Systems: Patient denies any headaches, hearing loss, fatigue, blurred vision, shortness of breath, chest pain, abdominal pain, problems with bowel movements, urination, or intercourse. No joint pain or mood swings.Periods heavy at times and lasts longer.    Physical Exam:BP 100/70 (BP Location: Left Arm, Patient Position: Sitting, Cuff Size: Normal)   Pulse 82   Ht 5' 4.5" (1.638 m)   Wt 182 lb (82.6 kg)   LMP 07/09/2016 Comment: has irregular periods  BMI 30.76 kg/m UPT negative. General:  Well developed, well nourished, no acute distress Skin:  Warm and dry Neck:  Midline trachea, normal thyroid, good ROM, no lymphadenopathy Lungs; Clear to auscultation bilaterally Breast:  No dominant palpable mass, retraction, or nipple discharge Cardiovascular: Regular rate and rhythm Abdomen:  Soft, non tender, no hepatosplenomegaly Pelvic:  External genitalia is normal in appearance, no lesions.  The vagina is normal in appearance. Urethra has no lesions or masses. The cervix is smooth,pap with GC/CHL performed.  Uterus is felt to be normal size, shape, and contour.  No adnexal masses or tenderness noted.Bladder is non tender, no masses felt. Extremities/musculoskeletal:  No swelling or varicosities noted, no clubbing or cyanosis Psych:  No mood changes, alert and cooperative,seems happy PHQ 2 score 0.  Impression: 1. Encounter for gynecological examination with Papanicolaou smear of cervix   2. Menorrhagia with regular cycle   3. Encounter for initial prescription of contraceptive pills   4. Pregnancy examination or test,  negative result       Plan: Meds ordered this encounter  Medications  . norgestimate-ethinyl estradiol (ORTHO-CYCLEN,SPRINTEC,PREVIFEM) 0.25-35 MG-MCG tablet    Sig: Take 1 tablet by mouth daily.    Dispense:  1 Package    Refill:  11    Order Specific Question:   Supervising Provider    Answer:   Lazaro ArmsEURE, LUTHER H [[id number]]   Physical in 1 year Pap in 3 if normal Start sprintec today Use condoms  Get family planning medicaid

## 2016-09-02 LAB — CYTOLOGY - PAP
CHLAMYDIA, DNA PROBE: POSITIVE — AB
DIAGNOSIS: NEGATIVE
Neisseria Gonorrhea: NEGATIVE

## 2016-09-03 ENCOUNTER — Encounter: Payer: Self-pay | Admitting: Adult Health

## 2016-09-03 ENCOUNTER — Telehealth: Payer: Self-pay | Admitting: Adult Health

## 2016-09-03 DIAGNOSIS — A749 Chlamydial infection, unspecified: Secondary | ICD-10-CM | POA: Insufficient documentation

## 2016-09-03 HISTORY — DX: Chlamydial infection, unspecified: A74.9

## 2016-09-03 MED ORDER — AZITHROMYCIN 500 MG PO TABS: ORAL_TABLET | ORAL | 0 refills | Status: DC

## 2016-09-03 NOTE — Telephone Encounter (Signed)
Left message @ 5:33 pm. JSY

## 2016-09-03 NOTE — Telephone Encounter (Signed)
Pt aware + chlamydia on pap, will treat with azithromycin 500 mg 2 po now , no sex, POC 1/8 at 10 am, [facility] sent, partner to clinic

## 2016-09-07 NOTE — Telephone Encounter (Signed)
I called pt to follow up and pt states she went on My Chart and found what she needed. JSY

## 2016-09-07 NOTE — Telephone Encounter (Signed)
Left message x 2. JSY 

## 2016-10-05 ENCOUNTER — Ambulatory Visit (INDEPENDENT_AMBULATORY_CARE_PROVIDER_SITE_OTHER): Payer: BLUE CROSS/BLUE SHIELD | Admitting: Adult Health

## 2016-10-05 ENCOUNTER — Encounter: Payer: Self-pay | Admitting: Adult Health

## 2016-10-05 VITALS — BP 105/62 | HR 77 | Ht 64.0 in | Wt 184.5 lb

## 2016-10-05 DIAGNOSIS — Z8619 Personal history of other infectious and parasitic diseases: Secondary | ICD-10-CM

## 2016-10-05 DIAGNOSIS — A749 Chlamydial infection, unspecified: Secondary | ICD-10-CM

## 2016-10-05 NOTE — Progress Notes (Signed)
Subjective:     Patient ID: Stacy Mayo, female   DOB: [birth year], 21 y.o.   MRN: [id number]  HPI Stacy Bosamerara is a 21 year old black female in for proof of cure on recent +chlamydia on pap.No odor any more.   Review of Systems  Denies any odor  Reviewed past medical,surgical, social and family history. Reviewed medications and allergies.     Objective:   Physical Exam BP 105/62 (BP Location: Left Arm, Patient Position: Sitting, Cuff Size: Normal)   Pulse 77   Ht 5\' 4"  (1.626 m)   Wt 184 lb 8 oz (83.7 kg)   LMP 09/11/2016 (Approximate)   BMI 31.67 kg/m PHQ 2 score 0.Skin warm and dry.Pelvic: external genitalia is normal in appearance no lesions, vagina: white discharge without odor,urethra has no lesions or masses noted, cervix:smooth, uterus: normal size, shape and contour, non tender, no masses felt, adnexa: no masses or tenderness noted. Bladder is non tender and no masses felt.  GC/CHL obtained.     Assessment:     History of chlamydia. For proof of sure     Plan:     GC/CHL sent  Use condoms Follow up prn

## 2016-10-05 NOTE — Patient Instructions (Signed)
Use condoms Follow up prn 

## 2016-10-07 LAB — GC/CHLAMYDIA PROBE AMP
CHLAMYDIA, DNA PROBE: NEGATIVE
NEISSERIA GONORRHOEAE BY PCR: NEGATIVE

## 2016-12-04 ENCOUNTER — Encounter (HOSPITAL_COMMUNITY): Payer: Self-pay

## 2016-12-04 ENCOUNTER — Emergency Department (HOSPITAL_COMMUNITY)
Admission: EM | Admit: 2016-12-04 | Discharge: 2016-12-04 | Disposition: A | Discharge status: Home / Self Care | Payer: BLUE CROSS/BLUE SHIELD | Attending: Emergency Medicine | Admitting: Emergency Medicine

## 2016-12-04 DIAGNOSIS — N764 Abscess of vulva: Secondary | ICD-10-CM | POA: Insufficient documentation

## 2016-12-04 DIAGNOSIS — N898 Other specified noninflammatory disorders of vagina: Secondary | ICD-10-CM

## 2016-12-04 DIAGNOSIS — L0291 Cutaneous abscess, unspecified: Secondary | ICD-10-CM

## 2016-12-04 LAB — WET PREP, GENITAL
Clue Cells Wet Prep HPF POC: NONE SEEN
SPERM: NONE SEEN
Trich, Wet Prep: NONE SEEN
Yeast Wet Prep HPF POC: NONE SEEN

## 2016-12-04 NOTE — ED Provider Notes (Signed)
[facility] DEPT Provider Note   CSN: [id number] Arrival date & time: 12/04/16  1643     History   Chief Complaint Chief Complaint  Patient presents with  . Abscess    HPI Stacy Mayo is a 21 y.o. female.  Patient is a 21 year old female who presents to the emergency department with a complaint of wound to the right vaginal labia, and vaginal discharge.  The patient states that she was shaving about 6 or 7 days ago when she felt a burning sensation. Later she noticed a raised area on the vaginal labia on the left. She now notices an area of drainage. She states she has some discomfort there but not a severe pain. She's not had any high fever related to it.  The patient also has noticed a vaginal discharge. She was to be checked for sexually transmitted disease. She has not been notified by any of her sexual contacts of any nodes sexual transmitted disease, but she would like to be checked because of the discharge that she is noted. It is of note that the patient is on oral birth control.      Past Medical History:  Diagnosis Date  . BV (bacterial vaginosis) 10/11/2014  . Chlamydia 09/03/2016  . Contraceptive management 10/11/2014  . Menorrhagia with regular cycle 10/11/2014  . Vaginal discharge 10/11/2014  . Weight gain 10/11/2014    Patient Active Problem List   Diagnosis Date Noted  . Chlamydia 09/03/2016  . Menorrhagia with regular cycle 10/11/2014  . Vaginal discharge 10/11/2014  . BV (bacterial vaginosis) 10/11/2014  . Contraceptive management 10/11/2014  . Weight gain 10/11/2014    Past Surgical History:  Procedure Laterality Date  . NO PAST SURGERIES      OB History    Gravida Para Term Preterm AB Living   0 0 0 0 0 0   SAB TAB Ectopic Multiple Live Births   0 0 0 0         Home Medications    Prior to Admission medications   Medication Sig Start Date End Date Taking? Authorizing Provider  norgestimate-ethinyl estradiol  (ORTHO-CYCLEN,SPRINTEC,PREVIFEM) 0.25-35 MG-MCG tablet Take 1 tablet by mouth daily. 09/01/16   Adline Potter, NP    Family History Family History  Problem Relation Age of Onset  . Hypertension Maternal Grandmother   . Diabetes Maternal Grandfather     Social History Social History  Substance Use Topics  . Smoking status: Never Smoker  . Smokeless tobacco: Never Used  . Alcohol use No     Allergies   Patient has no known allergies.   Review of Systems Review of Systems  Constitutional: Negative for activity change.       All ROS Neg except as noted in HPI  HENT: Negative for nosebleeds.   Eyes: Negative for photophobia and discharge.  Respiratory: Negative for cough, shortness of breath and wheezing.   Cardiovascular: Negative for chest pain and palpitations.  Gastrointestinal: Negative for abdominal pain and blood in stool.  Genitourinary: Positive for vaginal discharge. Negative for dysuria, frequency, hematuria, pelvic pain, vaginal bleeding and vaginal pain.  Musculoskeletal: Negative for arthralgias, back pain and neck pain.  Skin: Negative.   Neurological: Negative for dizziness, seizures and speech difficulty.  Psychiatric/Behavioral: Negative for confusion and hallucinations.     Physical Exam Updated Vital Signs BP 106/63 (BP Location: Left Arm)   Pulse 77   Temp 98.1 F (36.7 C) (Oral)   Resp 16   Ht 5\' 4"  (  1.626 m)   Wt 82.6 kg   LMP 11/13/2016   SpO2 98%   BMI 31.24 kg/m   Physical Exam  Constitutional: She is oriented to person, place, and time. She appears well-developed and well-nourished.  Non-toxic appearance.  HENT:  Head: Normocephalic.  Right Ear: Tympanic membrane and external ear normal.  Left Ear: Tympanic membrane and external ear normal.  Eyes: EOM and lids are normal. Pupils are equal, round, and reactive to light.  Neck: Normal range of motion. Neck supple. Carotid bruit is not present.  Cardiovascular: Normal rate, regular  rhythm, normal heart sounds, intact distal pulses and normal pulses.   Pulmonary/Chest: Breath sounds normal. No respiratory distress.  Abdominal: Soft. Bowel sounds are normal. There is no tenderness. There is no guarding.  Genitourinary:  Genitourinary Comments: Chaperone present during the examination. Her graft the patient has a denuded area with mild drainage at the left internal labia. There is also a raised bump beside the noted area on the same left labia. No palpable abscess at this time. No abscess involvement of the vaginal vault. There no blisters appreciated. There no other rash noted on the external vaginal area.  There is no foreign body noted in the vaginal vault. There is a white to gray discharge present the os of the cervix is closed. There is mild increased redness of the cervix. There is no cervical wall motion tenderness. There is no adnexal mass or tenderness.  Musculoskeletal: Normal range of motion.  Lymphadenopathy:       Head (right side): No submandibular adenopathy present.       Head (left side): No submandibular adenopathy present.    She has no cervical adenopathy.  Neurological: She is alert and oriented to person, place, and time. She has normal strength. No cranial nerve deficit or sensory deficit.  Skin: Skin is warm and dry.  Psychiatric: She has a normal mood and affect. Her speech is normal.  Nursing note and vitals reviewed.    ED Treatments / Results  Labs (all labs ordered are listed, but only abnormal results are displayed) Labs Reviewed  WET PREP, GENITAL - Abnormal; Notable for the following:       Result Value   WBC, Wet Prep HPF POC FEW (*)    All other components within normal limits  RPR  HIV ANTIBODY (ROUTINE TESTING)  GC/CHLAMYDIA PROBE AMP (Agency) NOT [facility]    EKG  EKG Interpretation None       Radiology No results found.  Procedures Procedures (including critical care time)  Medications Ordered in  ED Medications - No data to display   Initial Impression / Assessment and Plan / ED Course  I have reviewed the triage vital signs and the nursing notes.  Pertinent labs & imaging results that were available during my care of the patient were reviewed by me and considered in my medical decision making (see chart for details).     *I have reviewed nursing notes, vital signs, and all appropriate lab and imaging results for this patient.**  Final Clinical Impressions(s) / ED Diagnoses MDM Vital signs within normal limits. There is minimal drainage from the denuded area of the left internal labia. There is no drainage from the raised bump on the left labia. There is no palpable abscess or abscess wall beneath these 2 areas. I informed the patient that the drainage that she has had most likely represents the bulk of the abscess issue on. I've encouraged her to  monitor these closely. She will use warm tub soaks until this area has resolved.  The patient has a vaginal discharge present. She is concerned about possible exposure to sexually transmitted disease. Cultures have been sent to the lab. Blood work for RPR and HIV also been sent. The patient is to follow-up with Dr. Emelda Fear, if any changes, problems, or concerns.    Final diagnoses:  Abscess  Vaginal discharge    New Prescriptions New Prescriptions   No medications on file     Ivery Quale, Cordelia Poche 12/04/16 1959    Eber Hong, MD 12/05/16 [phone]

## 2016-12-04 NOTE — ED Triage Notes (Signed)
Patient reports of shaving and felt burning and now has a raised area to skin.  States she also wants to get checked for STD. Denies any symptoms.

## 2016-12-04 NOTE — Discharge Instructions (Signed)
Your vital signs within normal limits. The abscess of your vaginal labia seem to be resolving. Please use warm tub soaks daily until this has completely resolved. Please use loose clothing and loose underwear so that the area is not irritated. Your wet prep is negative for yeast infection or signs of bacterial vaginosis this time. Someone from the flow managers office will call you if there are any abnormalities of your cultures, or your test.

## 2016-12-04 NOTE — ED Notes (Signed)
Pt has a wound to her R upper thigh/labial area that is open and draining- she reports that it has been there since Saturday and she just felt it

## 2016-12-05 LAB — HIV ANTIBODY (ROUTINE TESTING W REFLEX): HIV SCREEN 4TH GENERATION: NONREACTIVE

## 2016-12-05 LAB — RPR: RPR: NONREACTIVE

## 2016-12-07 LAB — GC/CHLAMYDIA PROBE AMP (~~LOC~~) NOT AT ARMC
Chlamydia: NEGATIVE
NEISSERIA GONORRHEA: NEGATIVE

## 2017-07-07 ENCOUNTER — Encounter: Payer: Self-pay | Admitting: Adult Health

## 2017-07-07 ENCOUNTER — Ambulatory Visit (INDEPENDENT_AMBULATORY_CARE_PROVIDER_SITE_OTHER): Payer: No Typology Code available for payment source | Admitting: Adult Health

## 2017-07-07 VITALS — BP 112/64 | HR 77 | Ht 64.0 in | Wt 190.0 lb

## 2017-07-07 DIAGNOSIS — N926 Irregular menstruation, unspecified: Secondary | ICD-10-CM | POA: Diagnosis not present

## 2017-07-07 DIAGNOSIS — Z3202 Encounter for pregnancy test, result negative: Secondary | ICD-10-CM | POA: Diagnosis not present

## 2017-07-07 DIAGNOSIS — Z113 Encounter for screening for infections with a predominantly sexual mode of transmission: Secondary | ICD-10-CM | POA: Diagnosis not present

## 2017-07-07 LAB — POCT URINE PREGNANCY: PREG TEST UR: NEGATIVE

## 2017-07-07 NOTE — Progress Notes (Signed)
Subjective:     Patient ID: Scarlett Portlock, female   DOB: 01/18/1996, 21 y.o.   MRN: [id number]  HPI Kenetra os a 21 year old black female in for UPT, has missed a period.Periods had been regular,and she is using condoms, and not ready to be pregnant.She had stopped OCs last year. She said she spotted some 10/6 and 10/7 but no period.   Review of Systems Missed period Reviewed past medical,surgical, social and family history. Reviewed medications and allergies.     Objective:   Physical Exam BP 112/64 (BP Location: Right Arm, Patient Position: Sitting, Cuff Size: Normal)   Pulse 77   Ht  (1.626 m)   Wt 190 lb (86.2 kg)   LMP 05/31/2017 (Exact Date)   BMI 32.61 kg/m UPT negative, Skin warm and dry. Lungs: clear to ausculation bilaterally. Cardiovascular: regular rate and rhythm.   Keep period calendar, and if no period in 3 months call me and will try provera. Discussed that may not have ovulated this month and still may get cycle, just later.   Assessment:     1. Missed period   2. Pregnancy examination or test, negative result   3. Screening examination for STD (sexually transmitted disease)       Plan:    GC/CHL sent on urine Call if no period in 3 months Take folic acid Continue to use condoms

## 2017-07-07 NOTE — Patient Instructions (Signed)
Call if no period in 3 months  Take folic acid

## 2017-07-09 LAB — GC/CHLAMYDIA PROBE AMP
CHLAMYDIA, DNA PROBE: NEGATIVE
NEISSERIA GONORRHOEAE BY PCR: NEGATIVE

## 2017-09-02 ENCOUNTER — Other Ambulatory Visit: Payer: BLUE CROSS/BLUE SHIELD | Admitting: Adult Health

## 2017-12-02 ENCOUNTER — Encounter (HOSPITAL_COMMUNITY): Payer: Self-pay | Admitting: Emergency Medicine

## 2017-12-02 ENCOUNTER — Other Ambulatory Visit: Payer: Self-pay

## 2017-12-02 ENCOUNTER — Emergency Department (HOSPITAL_COMMUNITY)
Admission: EM | Admit: 2017-12-02 | Discharge: 2017-12-02 | Disposition: A | Discharge status: Home / Self Care | Payer: BLUE CROSS/BLUE SHIELD | Attending: Emergency Medicine | Admitting: Emergency Medicine

## 2017-12-02 DIAGNOSIS — H9203 Otalgia, bilateral: Secondary | ICD-10-CM | POA: Insufficient documentation

## 2017-12-02 MED ORDER — ANTIPYRINE-BENZOCAINE 5.4-1.4 % OT SOLN: 3.0000 [drp] | OTIC | 0 refills | Status: DC | PRN

## 2017-12-02 MED ORDER — PSEUDOEPHEDRINE HCL 60 MG PO TABS: 60.0000 mg | ORAL_TABLET | Freq: Four times a day (QID) | ORAL | 0 refills | Status: DC | PRN

## 2017-12-02 NOTE — ED Provider Notes (Signed)
[facility] Provider Note   CSN: [id number] Arrival date & time: 12/02/17  1558     History   Chief Complaint Chief Complaint  Patient presents with  . Ear Fullness    HPI Stacy [facility] is a 22 y.o. female.  HPI   Stacy [facility] is a 22 y.o. female who presents to the [facility] complaining of bilateral ear pain and "fullness" for one week.  She states his sensation is constant and is unaffected by movement of her ear or position change.  She has not tried any medications or therapies prior to arrival.  She also denies any recent illness, fever, dizziness, headache, drainage of her ears and sore throat.   Past Medical History:  Diagnosis Date  . BV (bacterial vaginosis) 10/11/2014  . Chlamydia 09/03/2016  . Contraceptive management 10/11/2014  . Menorrhagia with regular cycle 10/11/2014  . Vaginal discharge 10/11/2014  . Weight gain 10/11/2014    Patient Active Problem List   Diagnosis Date Noted  . Chlamydia 09/03/2016  . Menorrhagia with regular cycle 10/11/2014  . Vaginal discharge 10/11/2014  . BV (bacterial vaginosis) 10/11/2014  . Contraceptive management 10/11/2014  . Weight gain 10/11/2014    Past Surgical History:  Procedure Laterality Date  . NO PAST SURGERIES      OB History    Gravida Para Term Preterm AB Living   0 0 0 0 0 0   SAB TAB Ectopic Multiple Live Births   0 0 0 0         Home Medications    Prior to Admission medications   Medication Sig Start Date End Date Taking? Authorizing Provider  antipyrine-benzocaine Lyla Son(AURALGAN) OTIC solution Place 3-4 drops into both ears every 2 (two) hours as needed for ear pain. 12/02/17   Onnie Alatorre, PA-C  pseudoephedrine (SUDAFED) 60 MG tablet Take 1 tablet (60 mg total) by mouth every 6 (six) hours as needed for congestion. 12/02/17   Pauline Ausriplett, Zain Bingman, PA-C    Family History Family History  Problem Relation Age of Onset  . Hypertension Maternal Grandmother   . Diabetes  Maternal Grandfather     Social History Social History   Tobacco Use  . Smoking status: Never Smoker  . Smokeless tobacco: Never Used  Substance Use Topics  . Alcohol use: No  . Drug use: No     Allergies   Patient has no known allergies.   Review of Systems Review of Systems  Constitutional: Negative for activity change, appetite change, chills and fever.  HENT: Positive for ear pain. Negative for congestion, ear discharge, facial swelling, hearing loss, rhinorrhea, sinus pressure, sore throat and trouble swallowing.   Eyes: Negative for visual disturbance.  Respiratory: Negative for cough, shortness of breath, wheezing and stridor.   Gastrointestinal: Negative for nausea and vomiting.  Musculoskeletal: Negative for neck pain and neck stiffness.  Skin: Negative for rash.  Neurological: Negative for dizziness, weakness, numbness and headaches.  Hematological: Negative for adenopathy.  Psychiatric/Behavioral: Negative for confusion.  All other systems reviewed and are negative.    Physical Exam Updated Vital Signs BP 117/70 (BP Location: Right Arm)   Pulse 69   Temp 98 F (36.7 C) (Oral)   Resp 18   Ht 5\' 4"  (1.626 m)   Wt 88.9 kg (196 lb)   LMP 11/22/2017   SpO2 100%   BMI 33.64 kg/m   Physical Exam  Constitutional: She is oriented to person, place, and time. She appears well-developed and well-nourished.  No distress.  HENT:  Head: Normocephalic and atraumatic.  Right Ear: Ear canal normal. Tympanic membrane is not perforated and not erythematous. A middle ear effusion is present.  Left Ear: Ear canal normal. Tympanic membrane is not perforated and not erythematous. A middle ear effusion is present.  Mouth/Throat: Uvula is midline, oropharynx is clear and moist and mucous membranes are normal. No uvula swelling. No oropharyngeal exudate.  Air fluid levels of the bilateral middle ears with left > right.  No erythema or bulging  Neck: Normal range of motion.  Neck supple.  Cardiovascular: Normal rate, regular rhythm, normal heart sounds and intact distal pulses.  No murmur heard. Pulmonary/Chest: Effort normal and breath sounds normal. No stridor. No respiratory distress.  Lymphadenopathy:    She has no cervical adenopathy.  Neurological: She is alert and oriented to person, place, and time. Coordination normal.  Skin: Skin is warm and dry. No rash noted.  Nursing note and vitals reviewed.    ED Treatments / Results  Labs (all labs ordered are listed, but only abnormal results are displayed) Labs Reviewed - No data to display  EKG  EKG Interpretation None       Radiology No results found.  Procedures Procedures (including critical care time)  Medications Ordered in ED Medications - No data to display   Initial Impression / Assessment and Plan / ED Course  I have reviewed the triage vital signs and the nursing notes.  Pertinent labs & imaging results that were available during my care of the patient were reviewed by me and considered in my medical decision making (see chart for details).    Air-fluid levels of the bilateral middle ears without obvious otitis media.  Patient is neurovascularly intact.  No focal neuro deficits on exam.  Patient agrees to treatment plan with decongestant and Auralgan eardrops.   Final Clinical Impressions(s) / ED Diagnoses   Final diagnoses:  Otalgia of both ears    ED Discharge Orders        Ordered    pseudoephedrine (SUDAFED) 60 MG tablet  Every 6 hours PRN     12/02/17 1827    antipyrine-benzocaine (AURALGAN) OTIC solution  Every 2 hours PRN     12/02/17 1827       Pauline Aus, PA-C 12/02/17 2102    Loren Racer, MD 12/06/17 1459

## 2017-12-02 NOTE — ED Triage Notes (Signed)
Patient c/o bilateral ear fullness and pressure x1 week. Patient denies any drainage, fevers, or sore throat.

## 2017-12-02 NOTE — Discharge Instructions (Signed)
You may take Tylenol or ibuprofen every 4-6 hours if needed for fever.  Follow-up with your primary doctor or return here for any worsening symptoms.

## 2018-02-07 ENCOUNTER — Other Ambulatory Visit: Payer: No Typology Code available for payment source | Admitting: Adult Health

## 2019-02-06 ENCOUNTER — Emergency Department (HOSPITAL_COMMUNITY)
Admission: EM | Admit: 2019-02-06 | Discharge: 2019-02-06 | Disposition: A | Discharge status: Home / Self Care | Payer: Managed Care, Other (non HMO) | Attending: Emergency Medicine | Admitting: Emergency Medicine

## 2019-02-06 ENCOUNTER — Other Ambulatory Visit: Payer: Self-pay

## 2019-02-06 ENCOUNTER — Encounter (HOSPITAL_COMMUNITY): Payer: Self-pay

## 2019-02-06 ENCOUNTER — Telehealth: Payer: Self-pay | Admitting: Adult Health

## 2019-02-06 DIAGNOSIS — B888 Other specified infestations: Secondary | ICD-10-CM | POA: Insufficient documentation

## 2019-02-06 DIAGNOSIS — B882 Other arthropod infestations: Secondary | ICD-10-CM

## 2019-02-06 DIAGNOSIS — Z711 Person with feared health complaint in whom no diagnosis is made: Secondary | ICD-10-CM | POA: Diagnosis not present

## 2019-02-06 NOTE — Discharge Instructions (Addendum)
As discussed, you may want to try containing this insect to show a bug person (exterminator) who may be better able to help you get rid of these insects.

## 2019-02-06 NOTE — Telephone Encounter (Signed)
Pt sees black things moving on her skin. Itching every now and then. Pt noticed it Friday. Pt advised to go to Urgent Care for treatment. Pt voiced understanding. JSY

## 2019-02-06 NOTE — ED Notes (Signed)
Signature pad not working.  Pt verbalized understanding of instructions.

## 2019-02-06 NOTE — ED Triage Notes (Signed)
Pt reports tiny black bugs crawling on her at times when she is in her home. States there are none there right now and only itches when she sees them.  States her mother visualized them as well.

## 2019-02-06 NOTE — ED Provider Notes (Signed)
[facility] Provider Note   CSN: [id number] Arrival date & time: 02/06/19  0850    History   Chief Complaint Chief Complaint  Patient presents with  . pt seeing bugs    HPI Stacy [facility] is a 23 y.o. female presenting with complaint of intermittently seeing insects in her home for the past 3 days.  She recently visited her cousin 4 days ago who owns a dog, denies any other possible exposures.  She reports findings tiny black dot like insects, several on her phone, and has had several episodes of them crawling on her arms, especially when working at her home desk.  She denies any bites, only complaint is for itching while in direct contact.  Her mother who lives in the home has also visualized these insects (and sent pictures while pt here, tiny black dots of unclear etiology).      The history is provided by the patient.    Past Medical History:  Diagnosis Date  . BV (bacterial vaginosis) 10/11/2014  . Chlamydia 09/03/2016  . Contraceptive management 10/11/2014  . Menorrhagia with regular cycle 10/11/2014  . Vaginal discharge 10/11/2014  . Weight gain 10/11/2014    Patient Active Problem List   Diagnosis Date Noted  . Chlamydia 09/03/2016  . Menorrhagia with regular cycle 10/11/2014  . Vaginal discharge 10/11/2014  . BV (bacterial vaginosis) 10/11/2014  . Contraceptive management 10/11/2014  . Weight gain 10/11/2014    Past Surgical History:  Procedure Laterality Date  . NO PAST SURGERIES       OB History    Gravida  0   Para  0   Term  0   Preterm  0   AB  0   Living  0     SAB  0   TAB  0   Ectopic  0   Multiple  0   Live Births               Home Medications    Prior to Admission medications   Medication Sig Start Date End Date Taking? Authorizing Provider  antipyrine-benzocaine Lyla Son(AURALGAN) OTIC solution Place 3-4 drops into both ears every 2 (two) hours as needed for ear pain. 12/02/17   Triplett, Tammy, PA-C   pseudoephedrine (SUDAFED) 60 MG tablet Take 1 tablet (60 mg total) by mouth every 6 (six) hours as needed for congestion. 12/02/17   Pauline Ausriplett, Tammy, PA-C    Family History Family History  Problem Relation Age of Onset  . Hypertension Maternal Grandmother   . Diabetes Maternal Grandfather     Social History Social History   Tobacco Use  . Smoking status: Never Smoker  . Smokeless tobacco: Never Used  Substance Use Topics  . Alcohol use: No  . Drug use: No     Allergies   Patient has no known allergies.   Review of Systems Review of Systems  Constitutional: Negative for chills and fever.  Respiratory: Negative for shortness of breath and wheezing.   Skin: Negative for color change, rash and wound.  Neurological: Negative for numbness.     Physical Exam Updated Vital Signs BP 122/76 (BP Location: Left Arm)   Pulse 90   Temp 98 F (36.7 C) (Oral)   Resp 16   Ht 5\' 4"  (1.626 m)   Wt 91.6 kg   SpO2 100%   BMI 34.67 kg/m   Physical Exam Constitutional:      General: She is not in acute distress.  Appearance: Normal appearance. She is well-developed.  HENT:     Head: Normocephalic.  Neck:     Musculoskeletal: Neck supple.  Cardiovascular:     Rate and Rhythm: Normal rate.  Pulmonary:     Effort: Pulmonary effort is normal.     Breath sounds: No wheezing.  Musculoskeletal: Normal range of motion.  Skin:    General: Skin is warm and dry.     Findings: No rash.     Comments: Scalp examined closely, no nits or head lice. No rash, no insect bites.  Skin appears healthy.  She does have mild dandruff.  Neurological:     Mental Status: She is alert.  Psychiatric:        Mood and Affect: Mood normal.        Behavior: Behavior normal.        Thought Content: Thought content normal.      ED Treatments / Results  Labs (all labs ordered are listed, but only abnormal results are displayed) Labs Reviewed - No data to display  EKG None  Radiology No  results found.  Procedures Procedures (including critical care time)  Medications Ordered in ED Medications - No data to display   Initial Impression / Assessment and Plan / ED Course  I have reviewed the triage vital signs and the nursing notes.  Pertinent labs & imaging results that were available during my care of the patient were reviewed by me and considered in my medical decision making (see chart for details).       Pt with concerns for insects in her home, no bites or physical complaint.  Entertained  Possibility of psychological manifestation, but mother also has witnessed the insects.  With no bites, doubt fleas or bedbugs.  Pt was advised to contact an exterminator for further management of this problem.  Final Clinical Impressions(s) / ED Diagnoses   Final diagnoses:  Worried well  Infestation by insect    ED Discharge Orders    None       Victoriano Lain 02/06/19 5053    Eber Hong, MD 02/07/19 [phone]

## 2019-02-06 NOTE — Telephone Encounter (Signed)
Patient called stating that she has something on the back of her head and she would like to know if we can treat it since we a re her doctor. Please contact pt

## 2019-04-19 ENCOUNTER — Other Ambulatory Visit (HOSPITAL_COMMUNITY)
Admission: RE | Admit: 2019-04-19 | Discharge: 2019-04-19 | Disposition: A | Discharge status: Home / Self Care | Payer: Managed Care, Other (non HMO) | Source: Ambulatory Visit | Attending: Adult Health | Admitting: Adult Health

## 2019-04-19 ENCOUNTER — Other Ambulatory Visit: Payer: Self-pay

## 2019-04-19 ENCOUNTER — Ambulatory Visit (INDEPENDENT_AMBULATORY_CARE_PROVIDER_SITE_OTHER): Payer: Managed Care, Other (non HMO) | Admitting: Adult Health

## 2019-04-19 ENCOUNTER — Encounter: Payer: Self-pay | Admitting: Adult Health

## 2019-04-19 VITALS — BP 114/72 | HR 74 | Ht 64.2 in | Wt 216.0 lb

## 2019-04-19 DIAGNOSIS — R635 Abnormal weight gain: Secondary | ICD-10-CM

## 2019-04-19 DIAGNOSIS — N926 Irregular menstruation, unspecified: Secondary | ICD-10-CM

## 2019-04-19 DIAGNOSIS — Z01419 Encounter for gynecological examination (general) (routine) without abnormal findings: Secondary | ICD-10-CM

## 2019-04-19 DIAGNOSIS — Z13220 Encounter for screening for lipoid disorders: Secondary | ICD-10-CM

## 2019-04-19 NOTE — Addendum Note (Signed)
Addended by: Diona Fanti A on: 04/19/2019 10:29 AM   Modules accepted: Orders

## 2019-04-19 NOTE — Progress Notes (Signed)
Patient ID: Stacy [facility], female   DOB: September 04, 1996, 23 y.o.   MRN: [id number] History of Present Illness: Stacy [facility] is a 23 year old black female, single, G0P0, in for a well woman gyn exam and pap. She works at [employer].    Current Medications, Allergies, Past Medical History, Past Surgical History, Family History and Social History were reviewed in [employer] record.     Review of Systems: Patient denies any headaches, hearing loss, fatigue, blurred vision, shortness of breath, chest pain, abdominal pain, problems with bowel movements, urination, or intercourse(not currently active). No joint pain or mood swings. Periods irregular,may skip several months then may spot or bleed for 2-3 weeks, no pain, occasional clots. +weight gain   Physical Exam:BP 114/72 (BP Location: Left Arm, Patient Position: Sitting, Cuff Size: Normal)   Pulse 74   Ht 5' 4.2" (1.631 m)   Wt 216 lb (98 kg)   LMP 04/19/2019   BMI 36.85 kg/m  General:  Well developed, well nourished, no acute distress Skin:  Warm and dry Neck:  Midline trachea, normal thyroid, good ROM, no lymphadenopathy Lungs; Clear to auscultation bilaterally Breast:  No dominant palpable mass, retraction, or nipple discharge Cardiovascular: Regular rate and rhythm Abdomen:  Soft, non tender, no hepatosplenomegaly Pelvic:  External genitalia is normal in appearance, no lesions.  The vagina is normal in appearance.+brown blood. Urethra has no lesions or masses. The cervix is smooth, pap with GC/CHL 16/18 genotyping performed.Marland Kitchen  Uterus is felt to be normal size, shape, and contour.  No adnexal masses or tenderness noted.Bladder is non tender, no masses felt. Extremities/musculoskeletal:  No swelling or varicosities noted, no clubbing or cyanosis Psych:  No mood changes, alert and cooperative,seems happy Fall risk is low. PHQ 9 score is 1 Examination chaperoned by Diona Fanti CMA. Discussed OCs as option to regulate  period, will check labs in near future fasting.   Impression: 1. Encounter for gynecological examination with Papanicolaou smear of cervix   2. Irregular periods   3. Weight gain   4. Screening cholesterol level       Plan: Pap with GC/CHL and HPV 16/18 typing. Check CBC,CMP,TSH and lipids Physical in 1 year Pap in 3 if normal She will let me know after we talk about labs if she wants to start OCs

## 2019-04-20 LAB — CYTOLOGY - PAP
Adequacy: ABSENT
Chlamydia: NEGATIVE
Diagnosis: NEGATIVE
HPV 16/18/45 genotyping: NEGATIVE
HPV: DETECTED — AB
Neisseria Gonorrhea: NEGATIVE

## 2019-04-26 ENCOUNTER — Telehealth: Payer: Self-pay | Admitting: Adult Health

## 2019-04-26 ENCOUNTER — Encounter: Payer: Self-pay | Admitting: Adult Health

## 2019-04-26 DIAGNOSIS — R87810 Cervical high risk human papillomavirus (HPV) DNA test positive: Secondary | ICD-10-CM

## 2019-04-26 HISTORY — DX: Cervical high risk human papillomavirus (HPV) DNA test positive: R87.810

## 2019-04-26 NOTE — Telephone Encounter (Signed)
Pt aware pap was negative for malignancy  GC/CHL but +HPV but negative 16/18 will repeat HPV 16/18 in 1 year as per ASCCP 2019 guidelines

## 2019-04-29 LAB — COMPREHENSIVE METABOLIC PANEL
ALT: 10 IU/L (ref 0–32)
AST: 19 IU/L (ref 0–40)
Albumin/Globulin Ratio: 1.4 (ref 1.2–2.2)
Albumin: 4.7 g/dL (ref 3.9–5.0)
Alkaline Phosphatase: 81 IU/L (ref 39–117)
BUN/Creatinine Ratio: 12 (ref 9–23)
BUN: 10 mg/dL (ref 6–20)
Bilirubin Total: 0.4 mg/dL (ref 0.0–1.2)
CO2: 18 mmol/L — ABNORMAL LOW (ref 20–29)
Calcium: 9.7 mg/dL (ref 8.7–10.2)
Chloride: 102 mmol/L (ref 96–106)
Creatinine, Ser: 0.85 mg/dL (ref 0.57–1.00)
GFR calc Af Amer: 110 mL/min/{1.73_m2} (ref >59)
GFR calc non Af Amer: 96 mL/min/{1.73_m2} (ref >59)
Globulin, Total: 3.3 g/dL (ref 1.5–4.5)
Glucose: 80 mg/dL (ref 65–99)
Potassium: 4.2 mmol/L (ref 3.5–5.2)
Sodium: 140 mmol/L (ref 134–144)
Total Protein: 8 g/dL (ref 6.0–8.5)

## 2019-04-29 LAB — CBC
Hematocrit: 39.5 % (ref 34.0–46.6)
Hemoglobin: 13.4 g/dL (ref 11.1–15.9)
MCH: 28.8 pg (ref 26.6–33.0)
MCHC: 33.9 g/dL (ref 31.5–35.7)
MCV: 85 fL (ref 79–97)
Platelets: 294 10*3/uL (ref 150–450)
RBC: 4.66 x10E6/uL (ref 3.77–5.28)
RDW: 13.7 % (ref 11.7–15.4)
WBC: 5 10*3/uL (ref 3.4–10.8)

## 2019-04-29 LAB — LIPID PANEL
Chol/HDL Ratio: 3.4 ratio (ref 0.0–4.4)
Cholesterol, Total: 158 mg/dL (ref 100–199)
HDL: 47 mg/dL (ref >39)
LDL Calculated: 93 mg/dL (ref 0–99)
Triglycerides: 88 mg/dL (ref 0–149)
VLDL Cholesterol Cal: 18 mg/dL (ref 5–40)

## 2019-04-29 LAB — TSH: TSH: 2.1 u[IU]/mL (ref 0.450–4.500)

## 2019-05-19 ENCOUNTER — Other Ambulatory Visit: Payer: Self-pay | Admitting: Women's Health

## 2019-05-19 ENCOUNTER — Telehealth: Payer: Self-pay | Admitting: Adult Health

## 2019-05-19 MED ORDER — LO LOESTRIN FE 1 MG-10 MCG / 10 MCG PO TABS: 1.0000 | ORAL_TABLET | Freq: Every day | ORAL | 3 refills | Status: DC

## 2019-05-19 NOTE — Telephone Encounter (Signed)
Patient called, stated that Stacy [facility] told her if she had issues with her cycles that she could get back on bc, she'd like to.  Please advise.  [facility] [place]  [phone]

## 2019-05-19 NOTE — Telephone Encounter (Signed)
Pt is having heavy periods since July. Pt is wanting to start birth control pills. She talked to [place] about it at last appt. Thanks!! [place]

## 2019-06-23 ENCOUNTER — Encounter (HOSPITAL_COMMUNITY): Payer: Self-pay | Admitting: Emergency Medicine

## 2019-06-23 ENCOUNTER — Emergency Department (HOSPITAL_COMMUNITY)
Admission: EM | Admit: 2019-06-23 | Discharge: 2019-06-23 | Disposition: A | Discharge status: Home / Self Care | Payer: Managed Care, Other (non HMO) | Attending: Emergency Medicine | Admitting: Emergency Medicine

## 2019-06-23 ENCOUNTER — Other Ambulatory Visit: Payer: Self-pay

## 2019-06-23 DIAGNOSIS — N938 Other specified abnormal uterine and vaginal bleeding: Secondary | ICD-10-CM | POA: Insufficient documentation

## 2019-06-23 DIAGNOSIS — N939 Abnormal uterine and vaginal bleeding, unspecified: Secondary | ICD-10-CM | POA: Diagnosis present

## 2019-06-23 DIAGNOSIS — Z793 Long term (current) use of hormonal contraceptives: Secondary | ICD-10-CM | POA: Diagnosis not present

## 2019-06-23 LAB — BASIC METABOLIC PANEL
Anion gap: 10 (ref 5–15)
BUN: 10 mg/dL (ref 6–20)
CO2: 25 mmol/L (ref 22–32)
Calcium: 8.9 mg/dL (ref 8.9–10.3)
Chloride: 105 mmol/L (ref 98–111)
Creatinine, Ser: 0.72 mg/dL (ref 0.44–1.00)
GFR calc Af Amer: >60 mL/min (ref >60)
GFR calc non Af Amer: >60 mL/min (ref >60)
Glucose, Bld: 94 mg/dL (ref 70–99)
Potassium: 3.9 mmol/L (ref 3.5–5.1)
Sodium: 140 mmol/L (ref 135–145)

## 2019-06-23 LAB — CBC WITH DIFFERENTIAL/PLATELET
Abs Immature Granulocytes: 0.01 10*3/uL (ref 0.00–0.07)
Basophils Absolute: 0 10*3/uL (ref 0.0–0.1)
Basophils Relative: 1 %
Eosinophils Absolute: 0.3 10*3/uL (ref 0.0–0.5)
Eosinophils Relative: 6 %
HCT: 30.3 % — ABNORMAL LOW (ref 36.0–46.0)
Hemoglobin: 10.1 g/dL — ABNORMAL LOW (ref 12.0–15.0)
Immature Granulocytes: 0 %
Lymphocytes Relative: 29 %
Lymphs Abs: 1.4 10*3/uL (ref 0.7–4.0)
MCH: 30 pg (ref 26.0–34.0)
MCHC: 33.3 g/dL (ref 30.0–36.0)
MCV: 89.9 fL (ref 80.0–100.0)
Monocytes Absolute: 0.4 10*3/uL (ref 0.1–1.0)
Monocytes Relative: 8 %
Neutro Abs: 2.7 10*3/uL (ref 1.7–7.7)
Neutrophils Relative %: 56 %
Platelets: 304 10*3/uL (ref 150–400)
RBC: 3.37 MIL/uL — ABNORMAL LOW (ref 3.87–5.11)
RDW: 13.3 % (ref 11.5–15.5)
WBC: 4.8 10*3/uL (ref 4.0–10.5)
nRBC: 0 % (ref 0.0–0.2)

## 2019-06-23 LAB — I-STAT BETA HCG BLOOD, ED (MC, WL, AP ONLY): I-stat hCG, quantitative: <5 m[IU]/mL (ref <5)

## 2019-06-23 MED ORDER — MEGESTROL ACETATE 20 MG PO TABS
20.0000 mg | ORAL_TABLET | Freq: Two times a day (BID) | ORAL | Status: DC
  Filled 2019-06-23: qty 1

## 2019-06-23 MED ORDER — MEGESTROL ACETATE 20 MG PO TABS: 20.0000 mg | ORAL_TABLET | Freq: Two times a day (BID) | ORAL | 0 refills | Status: DC

## 2019-06-23 NOTE — ED Provider Notes (Signed)
[facility] EMERGENCY DEPARTMENT Provider Note   CSN: [id number] Arrival date & time: 06/23/19  3762     History   Chief Complaint Chief Complaint  Patient presents with  . Vaginal Bleeding    HPI Stacy [facility] is a 23 y.o. female.     HPI 23 year old female comes in a chief complaint of heavy bleeding.  Patient reports having irregular bleed for the last several years.  Over the last 2 weeks however she has been having daily bleeding that is fairly heavy.  She has been changing 5-6 pads a day.  She feels weak, but denies any exertional chest pain, shortness of breath, dizziness, near syncope.  She is not on any blood thinners.  There is no associated abdominal pain and patient denies any vaginal discharge.  She also reports being in a monogamous relationship and not considering herself at high risk for infection.  Past Medical History:  Diagnosis Date  . BV (bacterial vaginosis) 10/11/2014  . 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 Chlamydia 09/03/2016  . Contraceptive management 10/11/2014  . Menorrhagia with regular cycle 10/11/2014  . Vaginal discharge 10/11/2014  . Weight gain 10/11/2014    Patient Active Problem List   Diagnosis Date Noted  . 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 Encounter for gynecological examination with Papanicolaou smear of cervix 04/19/2019  . Irregular periods 04/19/2019  . Chlamydia 09/03/2016  . Menorrhagia with regular cycle 10/11/2014  . Vaginal discharge 10/11/2014  . BV (bacterial vaginosis) 10/11/2014  . Contraceptive management 10/11/2014  . Weight gain 10/11/2014    Past Surgical History:  Procedure Laterality Date  . NO PAST SURGERIES       OB History    Gravida  0   Para  0   Term  0   Preterm  0   AB  0   Living  0     SAB  0   TAB  0   Ectopic  0   Multiple  0   Live Births               Home Medications    Prior to Admission medications   Medication Sig Start Date End Date Taking? Authorizing Provider  LO LOESTRIN FE 1 MG-10 MCG / 10 MCG tablet Take 1 tablet by mouth daily. 05/19/19   Cheral Marker, CNM  megestrol (MEGACE) 20 MG tablet Take 1 tablet (20 mg total) by mouth 2 (two) times daily. 06/23/19   Derwood Kaplan, MD    Family History Family History  Problem Relation Age of Onset  . Hypertension Maternal Grandmother   . Diabetes Maternal Grandfather   . Heart failure Paternal Grandmother   . Lung disease Paternal Grandmother     Social History Social History   Tobacco Use  . Smoking status: Never Smoker  . Smokeless tobacco: Never Used  Substance Use Topics  . Alcohol use: No  . Drug use: No     Allergies   Patient has no known allergies.   Review of Systems Review of Systems  Constitutional: Positive for activity change.  Gastrointestinal: Negative for abdominal pain.  Genitourinary: Positive for vaginal bleeding.  Hematological: Does not bruise/bleed easily.     Physical Exam Updated Vital Signs BP (!) 110/56 (BP Location: Right Arm)   Pulse 82   Temp 98.1  F (36.7 C) (Oral)   Resp 16   SpO2 100%   Physical Exam Vitals signs and nursing note reviewed.  Constitutional:      Appearance: She is well-developed.  HENT:     Head: Normocephalic and atraumatic.  Neck:     Musculoskeletal: Normal range of motion and neck supple.  Cardiovascular:     Rate and Rhythm: Normal rate.  Pulmonary:     Effort: Pulmonary effort is normal.  Abdominal:     General: Bowel sounds are normal.     Tenderness: There is no abdominal tenderness.  Skin:    General: Skin is warm and dry.  Neurological:     Mental Status: She is alert and oriented to person, place, and time.      ED Treatments / Results  Labs (all labs ordered are listed, but only abnormal results are displayed) Labs Reviewed   CBC WITH DIFFERENTIAL/PLATELET - Abnormal; Notable for the following components:      Result Value   RBC 3.37 (*)    Hemoglobin 10.1 (*)    HCT 30.3 (*)    All other components within normal limits  BASIC METABOLIC PANEL  I-STAT BETA HCG BLOOD, ED ([facility], [facility], [facility] ONLY)    EKG None  Radiology No results found.  Procedures Procedures (including critical care time)  Medications Ordered in ED Medications - No data to display   Initial Impression / Assessment and Plan / ED Course  I have reviewed the triage vital signs and the nursing notes.  Pertinent labs & imaging results that were available during my care of the patient were reviewed by me and considered in my medical decision making (see chart for details).        23 year old female comes in a chief complaint of vaginal bleeding. She has had irregular periods, however recently she has been having heavy periods.  It appears that her menometrorrhagia is likely related to hormonal changes.  Pregnancy test is negative, hemoglobin is greater than 10.  There is no abdominal tenderness or pelvic tenderness.  Patient does not think she is having STDs and has deferred pelvic exam for now.  She used to be on birth control when she was much younger.  We will start her on Megace and advised her to follow-up with outpatient women's for optimal management.  Strict ER return precautions have been discussed, and patient is agreeing with the plan and is comfortable with the workup done and the recommendations from the ER.   Final Clinical Impressions(s) / ED Diagnoses   Final diagnoses:  DUB (dysfunctional uterine bleeding)    ED Discharge Orders         Ordered    megestrol (MEGACE) 20 MG tablet  2 times daily,   Status:  Discontinued     06/23/19 1146    megestrol (MEGACE) 20 MG tablet  2 times daily     06/23/19 1147           Varney Biles, MD 06/23/19 1307

## 2019-06-23 NOTE — ED Notes (Signed)
Pt verbalized understanding to follow up with [facility] and prescription use. VSS. NAD.

## 2019-06-23 NOTE — Discharge Instructions (Signed)
We saw in the ER for heavy bleeding.  Your hemoglobin is 10, drop from previous hemoglobin of 13.  You therefore have anemia because of the blood loss.  We recommend that you start taking the medications prescribed and see the outpatient doctors for further management of your bleeding.  Return to the ER if you start having dizziness, near fainting spells or the bleeding gets much worse.

## 2019-06-23 NOTE — ED Triage Notes (Signed)
C/o irregular periods for a long time-- was on birth control, made periods regular-- now has 30 day periods, off for about 3 weeks, then starts again-- has been passing clots. States has been "extra tired"

## 2019-07-04 ENCOUNTER — Ambulatory Visit (INDEPENDENT_AMBULATORY_CARE_PROVIDER_SITE_OTHER): Payer: Managed Care, Other (non HMO) | Admitting: Obstetrics & Gynecology

## 2019-07-04 ENCOUNTER — Other Ambulatory Visit: Payer: Self-pay

## 2019-07-04 ENCOUNTER — Encounter: Payer: Self-pay | Admitting: Obstetrics & Gynecology

## 2019-07-04 VITALS — BP 117/85 | HR 85 | Ht 64.0 in | Wt 214.0 lb

## 2019-07-04 DIAGNOSIS — N946 Dysmenorrhea, unspecified: Secondary | ICD-10-CM | POA: Diagnosis not present

## 2019-07-04 DIAGNOSIS — N926 Irregular menstruation, unspecified: Secondary | ICD-10-CM

## 2019-07-04 DIAGNOSIS — N921 Excessive and frequent menstruation with irregular cycle: Secondary | ICD-10-CM

## 2019-07-04 LAB — POCT HEMOGLOBIN: Hemoglobin: 8.6 g/dL — AB (ref 11–14.6)

## 2019-07-04 MED ORDER — MEGESTROL ACETATE 40 MG PO TABS: ORAL_TABLET | ORAL | 3 refills | Status: DC

## 2019-07-04 NOTE — Progress Notes (Signed)
Chief Complaint  Patient presents with  . Menorrhagia      23 y.o. G0P0000 Patient's last menstrual period was 06/08/2019. The current method of family planning is none.  Outpatient Encounter Medications as of 07/04/2019  Medication Sig  . megestrol (MEGACE) 20 MG tablet Take 1 tablet (20 mg total) by mouth 2 (two) times daily.  . LO LOESTRIN FE 1 MG-10 MCG / 10 MCG tablet Take 1 tablet by mouth daily. (Patient not taking: Reported on 07/04/2019)  . megestrol (MEGACE) 40 MG tablet 1 tablet daily   No facility-administered encounter medications on file as of 07/04/2019.     Subjective Follow up from ED Menometrorrhagia with long history of irregular cycles, probably irregular ovulatio This time bleeding very heavy, minimal cramping henoglobin in ED 10 today 8.6 has not bled since taking megestrol Past Medical History:  Diagnosis Date  . BV (bacterial vaginosis) 10/11/2014  . 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 Chlamydia 09/03/2016  . Contraceptive management 10/11/2014  . Menorrhagia with regular cycle 10/11/2014  . Vaginal discharge 10/11/2014  . Weight gain 10/11/2014    Past Surgical History:  Procedure Laterality Date  . NO PAST SURGERIES      OB History    Gravida  0   Para  0   Term  0   Preterm  0   AB  0   Living  0     SAB  0   TAB  0   Ectopic  0   Multiple  0   Live Births              No Known Allergies  Social History   Socioeconomic History  . Marital status: Single    Spouse name: Not on file  . Number of children: 0  . Years of education: Not on file  . Highest education level: Not on file  Occupational History  . Not on file  Social Needs  . Financial resource strain: Not on file  . Food insecurity    Worry: Not on file    Inability: Not on file  . Transportation needs    Medical: Not on file   Non-medical: Not on file  Tobacco Use  . Smoking status: Never Smoker  . Smokeless tobacco: Never Used  Substance and Sexual Activity  . Alcohol use: No  . Drug use: No  . Sexual activity: Not Currently    Birth control/protection: None  Lifestyle  . Physical activity    Days per week: Not on file    Minutes per session: Not on file  . Stress: Not on file  Relationships  . Social Herbalist on phone: Not on file    Gets together: Not on file    Attends religious service: Not on file    Active member of club or organization: Not on file    Attends meetings of clubs or organizations: Not on file    Relationship status: Not on file  Other Topics Concern  . Not on file  Social History Narrative  . Not on file    Family History  Problem Relation Age of Onset  . Hypertension Maternal Grandmother   . Diabetes Maternal Grandfather   . Heart failure Paternal Grandmother   . Lung disease Paternal Grandmother     Medications:  Current Outpatient Medications:  .  megestrol (MEGACE) 20 MG tablet, Take 1 tablet (20 mg total) by mouth 2 (two) times daily., Disp: 20 tablet, Rfl: 0 .  LO LOESTRIN FE 1 MG-10 MCG / 10 MCG tablet, Take 1 tablet by mouth daily. (Patient not taking: Reported on 07/04/2019), Disp: 3 Package, Rfl: 3 .  megestrol (MEGACE) 40 MG tablet, 1 tablet daily, Disp: 30 tablet, Rfl: 3  Objective Blood pressure 117/85, pulse 85, height 5\' 4"  (1.626 m), weight 214 lb (97.1 kg), last menstrual period 06/08/2019.  Gen WDWN NAD Recent pelvic by 08/08/2019 is normal  Pertinent ROS No burning with urination, frequency or urgency No nausea, vomiting or diarrhea Nor fever chills or other constitutional symptoms   Labs or studies reviewed    Impression Diagnoses this Encounter::   ICD-10-CM   1. Menometrorrhagia  N92.1 [id number] PELVIS TRANSVAGINAL NON-OB (TV ONLY)    US PELVIS (TRANSABDOMINAL ONLY)  2. Irregular periods  N92.6 POCT hemoglobin  3.  Dysmenorrhea  N94.6 US PELVIS TRANSVAGINAL NON-OB (TV ONLY)    US PELVIS (TRANSABDOMINAL ONLY)    Established relevant diagnosis(es):   Plan/Recommendations: Meds ordered this encounter  Medications  . megestrol (MEGACE) 40 MG tablet    Sig: 1 tablet daily    Dispense:  30 tablet    Refill:  3    Labs or Scans Ordered: Orders Placed This Encounter  Procedures  . US PELVIS TRANSVAGINAL NON-OB (TV ONLY)  . US PELVIS (TRANSABDOMINAL ONLY)  . POCT hemoglobin    Management:: >megace to make amenorrheic and OTC iron to improve hemoglobin >sonogram 3 months for anatomical evaluation after megetrol therapy  Follow up No follow-ups on file.        Face to face time:  15 minutes  Greater than 50% of the visit time was spent in counseling and coordination of care with the patient.  The summary and outline of the counseling and care coordination is summarized in the note above.   All questions were answered.

## 2019-09-14 ENCOUNTER — Telehealth: Payer: Self-pay | Admitting: *Deleted

## 2019-09-14 NOTE — Telephone Encounter (Signed)
Patient called with questions regarding medication prescribed by Dr Elonda Husky.

## 2019-09-15 NOTE — Telephone Encounter (Signed)
Left message @ 1:35 pm. JSY 

## 2019-09-18 NOTE — Telephone Encounter (Signed)
Patient left message that she is returning call to [place].

## 2019-09-18 NOTE — Telephone Encounter (Signed)
Pt concerned that she is still spotting with Megace. Taking it once a day. Bleeding is better than it was. Pt has an appt in Jan. Advised to keep that appt. Pt voiced understanding. [place]

## 2019-10-05 ENCOUNTER — Encounter: Payer: Self-pay | Admitting: Obstetrics & Gynecology

## 2019-10-05 ENCOUNTER — Other Ambulatory Visit: Payer: Self-pay

## 2019-10-05 ENCOUNTER — Ambulatory Visit (INDEPENDENT_AMBULATORY_CARE_PROVIDER_SITE_OTHER): Payer: Managed Care, Other (non HMO) | Admitting: Obstetrics & Gynecology

## 2019-10-05 ENCOUNTER — Ambulatory Visit (INDEPENDENT_AMBULATORY_CARE_PROVIDER_SITE_OTHER): Payer: Managed Care, Other (non HMO)

## 2019-10-05 VITALS — BP 116/72 | HR 105 | Ht 64.0 in | Wt 203.0 lb

## 2019-10-05 DIAGNOSIS — D500 Iron deficiency anemia secondary to blood loss (chronic): Secondary | ICD-10-CM

## 2019-10-05 DIAGNOSIS — N939 Abnormal uterine and vaginal bleeding, unspecified: Secondary | ICD-10-CM

## 2019-10-05 DIAGNOSIS — N921 Excessive and frequent menstruation with irregular cycle: Secondary | ICD-10-CM

## 2019-10-05 DIAGNOSIS — N946 Dysmenorrhea, unspecified: Secondary | ICD-10-CM | POA: Diagnosis not present

## 2019-10-05 LAB — POCT HEMOGLOBIN: Hemoglobin: 10.3 g/dL — AB (ref 11–14.6)

## 2019-10-05 MED ORDER — MEGESTROL ACETATE 40 MG PO TABS: ORAL_TABLET | ORAL | 11 refills | Status: DC

## 2019-10-05 NOTE — Progress Notes (Addendum)
Follow up appointment for results  Chief Complaint  Patient presents with  . Follow-up    Medication and [place] results    Blood pressure 116/72, pulse (!) 105, height 5\' 4"  (1.626 m), weight 203 lb (92.1 kg), last menstrual period 09/28/2019.  09/30/2019 PELVIS TRANSVAGINAL NON-OB (TV ONLY)  Result Date: 10/05/2019 GYNECOLOGIC SONOGRAM Stacy Mayo is a 24 y.o. G0P0000 LMP 09/28/2019,she is here for a pelvic sonogram for menometrorrhagia/dysmenorrhea. Uterus                      6.1 x 5.1 x 6.7 cm, Total uterine volume 108 cc, homogeneous anteverted uterus,wnl Endometrium          10.6 mm, symmetrical, heterogeneous endometrium,no color flow within the  endometrium Right ovary             4.2 x 2 x 2.9 cm, wnl Left ovary                4.3 x 1.9 x 3.4 cm, wnl small amount of simple cul de sac fluid Technician Comments: PELVIC 09/30/2019 TA/TV: homogeneous anteverted uterus,wnl,heterogeneous endometrium,no color flow within the  endometrium,EEC 10.6 mm,normal ovaries,ovaries appear mobile,no pain during ultrasound,small amount of simple cul de sac fluid Chaperone: [place] 10/05/2019 3:28 PM Clinical Impression and recommendations: I have reviewed the sonogram results above, combined with the patient's current clinical course, below are my impressions and any appropriate recommendations for management based on the sonographic findings. Uterus is normal size shape and contour endometrium is normal(pt is on megesrol) Both ovaries are normal 12/03/2019 10/05/2019 3:53 PM  12/03/2019 PELVIS (TRANSABDOMINAL ONLY)  Result Date: 10/05/2019 GYNECOLOGIC SONOGRAM Stacy Mayo is a 24 y.o. G0P0000 LMP 09/28/2019,she is here for a pelvic sonogram for menometrorrhagia/dysmenorrhea. Uterus                      6.1 x 5.1 x 6.7 cm, Total uterine volume 108 cc, homogeneous anteverted uterus,wnl Endometrium          10.6 mm, symmetrical, heterogeneous endometrium,no color flow within the  endometrium Right ovary             4.2 x 2 x  2.9 cm, wnl Left ovary                4.3 x 1.9 x 3.4 cm, wnl small amount of simple cul de sac fluid Technician Comments: PELVIC 09/30/2019 TA/TV: homogeneous anteverted uterus,wnl,heterogeneous endometrium,no color flow within the  endometrium,EEC 10.6 mm,normal ovaries,ovaries appear mobile,no pain during ultrasound,small amount of simple cul de sac fluid Chaperone: [place] 10/05/2019 3:28 PM Clinical Impression and recommendations: I have reviewed the sonogram results above, combined with the patient's current clinical course, below are my impressions and any appropriate recommendations for management based on the sonographic findings. Uterus is normal size shape and contour endometrium is normal(pt is on megesrol) Both ovaries are normal 12/03/2019 10/05/2019 3:53 PM     MEDS ordered this encounter: Meds ordered this encounter  Medications  . megestrol (MEGACE) 40 MG tablet    Sig: 1 tablet daily    Dispense:  30 tablet    Refill:  11    Orders for this encounter: Orders Placed This Encounter  Procedures  . POCT hemoglobin    Impression:   ICD-10-CM   1. Abnormal uterine bleeding (AUB)  N93.9   2. Vaginal bleeding  N93.9 POCT hemoglobin  3. Anemia due to  blood loss, chronic  D50.0       Plan: >cylce megace, say 2 months on, 1 week off, something like a normal OCP cycling  Follow Up: Return in about 6 months (around 04/03/2020) for Follow up, with Dr Elonda Husky.       Face to face time:  15 minutes  Greater than 50% of the visit time was spent in counseling and coordination of care with the patient.  The summary and outline of the counseling and care coordination is summarized in the note above.   All questions were answered.  Past Medical History:  Diagnosis Date  . BV (bacterial vaginosis) 10/11/2014  . 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 Chlamydia  09/03/2016  . Contraceptive management 10/11/2014  . Menorrhagia with regular cycle 10/11/2014  . Vaginal discharge 10/11/2014  . Weight gain 10/11/2014    Past Surgical History:  Procedure Laterality Date  . NO PAST SURGERIES      OB History    Gravida  0   Para  0   Term  0   Preterm  0   AB  0   Living  0     SAB  0   TAB  0   Ectopic  0   Multiple  0   Live Births              No Known Allergies  Social History   Socioeconomic History  . Marital status: Single    Spouse name: Not on file  . Number of children: 0  . Years of education: Not on file  . Highest education level: Not on file  Occupational History  . Not on file  Tobacco Use  . Smoking status: Never Smoker  . Smokeless tobacco: Never Used  Substance and Sexual Activity  . Alcohol use: No  . Drug use: No  . Sexual activity: Not Currently    Birth control/protection: None  Other Topics Concern  . Not on file  Social History Narrative  . Not on file   Social Determinants of Health   Financial Resource Strain:   . Difficulty of Paying Living Expenses: Not on file  Food Insecurity:   . Worried About Charity fundraiser in the Last Year: Not on file  . Ran Out of Food in the Last Year: Not on file  Transportation Needs:   . Lack of Transportation (Medical): Not on file  . Lack of Transportation (Non-Medical): Not on file  Physical Activity:   . Days of Exercise per Week: Not on file  . Minutes of Exercise per Session: Not on file  Stress:   . Feeling of Stress : Not on file  Social Connections:   . Frequency of Communication with Friends and Family: Not on file  . Frequency of Social Gatherings with Friends and Family: Not on file  . Attends Religious Services: Not on file  . Active Member of Clubs or Organizations: Not on file  . Attends Archivist Meetings: Not on file  . Marital Status: Not on file    Family History  Problem Relation Age of Onset  .  Hypertension Maternal Grandmother   . Diabetes Maternal Grandfather   . Heart failure Paternal Grandmother   . Lung disease Paternal Grandmother

## 2019-10-05 NOTE — Progress Notes (Signed)
PELVIC US TA/TV: homogeneous anteverted uterus,wnl,heterogeneous endometrium,no color flow within the  endometrium,EEC 10.6 mm,normal ovaries,ovaries appear mobile,no pain during ultrasound,small amount of simple cul de sac fluid

## 2019-10-05 NOTE — Addendum Note (Signed)
Addended by: Lazaro Arms on: 10/05/2019 04:07 PM   Modules accepted: Orders

## 2019-11-28 ENCOUNTER — Emergency Department (HOSPITAL_COMMUNITY)
Admission: EM | Admit: 2019-11-28 | Discharge: 2019-11-28 | Disposition: A | Discharge status: Home / Self Care | Payer: Self-pay | Attending: Emergency Medicine | Admitting: Emergency Medicine

## 2019-11-28 ENCOUNTER — Encounter (HOSPITAL_COMMUNITY): Payer: Self-pay | Admitting: Emergency Medicine

## 2019-11-28 ENCOUNTER — Other Ambulatory Visit: Payer: Self-pay

## 2019-11-28 DIAGNOSIS — Z79899 Other long term (current) drug therapy: Secondary | ICD-10-CM | POA: Insufficient documentation

## 2019-11-28 DIAGNOSIS — K5900 Constipation, unspecified: Secondary | ICD-10-CM | POA: Insufficient documentation

## 2019-11-28 LAB — CBC WITH DIFFERENTIAL/PLATELET
Abs Immature Granulocytes: 0.01 10*3/uL (ref 0.00–0.07)
Basophils Absolute: 0.1 10*3/uL (ref 0.0–0.1)
Basophils Relative: 1 %
Eosinophils Absolute: 1 10*3/uL — ABNORMAL HIGH (ref 0.0–0.5)
Eosinophils Relative: 14 %
HCT: 38.4 % (ref 36.0–46.0)
Hemoglobin: 12.1 g/dL (ref 12.0–15.0)
Immature Granulocytes: 0 %
Lymphocytes Relative: 25 %
Lymphs Abs: 1.9 10*3/uL (ref 0.7–4.0)
MCH: 28.8 pg (ref 26.0–34.0)
MCHC: 31.5 g/dL (ref 30.0–36.0)
MCV: 91.4 fL (ref 80.0–100.0)
Monocytes Absolute: 0.4 10*3/uL (ref 0.1–1.0)
Monocytes Relative: 6 %
Neutro Abs: 4 10*3/uL (ref 1.7–7.7)
Neutrophils Relative %: 54 %
Platelets: 396 10*3/uL (ref 150–400)
RBC: 4.2 MIL/uL (ref 3.87–5.11)
RDW: 13.4 % (ref 11.5–15.5)
WBC: 7.4 10*3/uL (ref 4.0–10.5)
nRBC: 0 % (ref 0.0–0.2)

## 2019-11-28 LAB — COMPREHENSIVE METABOLIC PANEL
ALT: 12 U/L (ref 0–44)
AST: 15 U/L (ref 15–41)
Albumin: 4.4 g/dL (ref 3.5–5.0)
Alkaline Phosphatase: 65 U/L (ref 38–126)
Anion gap: 7 (ref 5–15)
BUN: 11 mg/dL (ref 6–20)
CO2: 22 mmol/L (ref 22–32)
Calcium: 9 mg/dL (ref 8.9–10.3)
Chloride: 107 mmol/L (ref 98–111)
Creatinine, Ser: 0.79 mg/dL (ref 0.44–1.00)
GFR calc Af Amer: >60 mL/min (ref >60)
GFR calc non Af Amer: >60 mL/min (ref >60)
Glucose, Bld: 91 mg/dL (ref 70–99)
Potassium: 3.8 mmol/L (ref 3.5–5.1)
Sodium: 136 mmol/L (ref 135–145)
Total Bilirubin: 0.4 mg/dL (ref 0.3–1.2)
Total Protein: 8.3 g/dL — ABNORMAL HIGH (ref 6.5–8.1)

## 2019-11-28 LAB — OCCULT BLOOD X 1 CARD TO LAB, STOOL: Fecal Occult Bld: POSITIVE — AB

## 2019-11-28 NOTE — ED Provider Notes (Signed)
[facility] EMERGENCY DEPARTMENT Provider Note   CSN: [id number] Arrival date & time: 11/28/19  1236     History Chief Complaint  Patient presents with  . Rectal Bleeding    Stacy Mayo is a 24 y.o. female with past medical history as listed below presents to emergency department today with chief complaint of intermittent rectal bleeding x2 weeks.  She states she has had difficulty passing stool and when she does have a bowel movement the stool is hard and small pieces.  She denies history of constipation.  She has not tried any over-the-counter medications for symptoms prior to arrival.  She states she noticed bright red blood on the toilet paper after wiping. She also endorses history of anemia and is taking iron supplements.  She started taking them last year however stopped for a while and just started back recently within the last 1 month. She denies any associated abdominal pain, nausea or vomiting.  Also denies fever, chills, weakness, fatigue, chest pain, shortness of breath, gross hematuria, urinary frequency, dysuria, pelvic pain, abnormal vaginal bleeding, vaginal discharge, pain with defecation, trauma to the rectum or foreign body insertion.   History provided by patient with additional history obtained from chart review.     Past Medical History:  Diagnosis Date  . BV (bacterial vaginosis) 10/11/2014  . 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 Chlamydia 09/03/2016  . Contraceptive management 10/11/2014  . Menorrhagia with regular cycle 10/11/2014  . Vaginal discharge 10/11/2014  . Weight gain 10/11/2014    Patient Active Problem List   Diagnosis Date Noted  . 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 Encounter for gynecological examination with Papanicolaou smear of cervix 04/19/2019  . Irregular periods 04/19/2019  . Chlamydia  09/03/2016  . Menorrhagia with regular cycle 10/11/2014  . Vaginal discharge 10/11/2014  . BV (bacterial vaginosis) 10/11/2014  . Contraceptive management 10/11/2014  . Weight gain 10/11/2014    Past Surgical History:  Procedure Laterality Date  . NO PAST SURGERIES       OB History    Gravida  0   Para  0   Term  0   Preterm  0   AB  0   Living  0     SAB  0   TAB  0   Ectopic  0   Multiple  0   Live Births              Family History  Problem Relation Age of Onset  . Hypertension Maternal Grandmother   . Diabetes Maternal Grandfather   . Heart failure Paternal Grandmother   . Lung disease Paternal Grandmother     Social History   Tobacco Use  . Smoking status: Never Smoker  . Smokeless tobacco: Never Used  Substance Use Topics  . Alcohol use: No  . Drug use: No    Home Medications Prior to Admission medications   Medication Sig Start Date End Date Taking? Authorizing Provider  megestrol (MEGACE) 40 MG tablet 1 tablet daily 07/04/19   Lazaro Arms, MD  megestrol (MEGACE) 40 MG tablet 1 tablet daily 10/05/19   Lazaro Arms, MD    Allergies    Patient has no known allergies.  Review of Systems   Review of Systems  All other systems are reviewed and are negative for acute change except  as noted in the HPI.   Physical Exam Updated Vital Signs BP 127/68   Pulse 92   Temp 98.6 F (37 C)   Resp 17   Ht 5\' 4"  (1.626 m)   Wt 91.2 kg   SpO2 97%   BMI 34.50 kg/m   Physical Exam Vitals and nursing note reviewed.  Constitutional:      General: She is not in acute distress.    Appearance: She is not ill-appearing.  HENT:     Head: Normocephalic and atraumatic.     Right Ear: Tympanic membrane and external ear normal.     Left Ear: Tympanic membrane and external ear normal.     Nose: Nose normal.     Mouth/Throat:     Mouth: Mucous membranes are moist.     Pharynx: Oropharynx is clear.  Eyes:     General: No scleral icterus.        Right eye: No discharge.        Left eye: No discharge.     Extraocular Movements: Extraocular movements intact.     Conjunctiva/sclera: Conjunctivae normal.     Pupils: Pupils are equal, round, and reactive to light.  Neck:     Vascular: No JVD.  Cardiovascular:     Rate and Rhythm: Normal rate and regular rhythm.     Pulses: Normal pulses.          Radial pulses are 2+ on the right side and 2+ on the left side.     Heart sounds: Normal heart sounds.  Pulmonary:     Comments: Lungs clear to auscultation in all fields. Symmetric chest rise. No wheezing, rales, or rhonchi. Abdominal:     Comments: Abdomen is soft, non-distended, and non-tender in all quadrants. No rigidity, no guarding. No peritoneal signs.  Genitourinary:    Comments: present for exam. Digital Rectal Exam reveals sphincter with good tone. No external hemorrhoids. No masses or fissures. Stool color is brown with no overt blood. No gross melena.  Musculoskeletal:        General: Normal range of motion.     Cervical back: Normal range of motion.  Skin:    General: Skin is warm and dry.     Capillary Refill: Capillary refill takes less than 2 seconds.  Neurological:     Mental Status: She is oriented to person, place, and time.     GCS: GCS eye subscore is 4. GCS verbal subscore is 5. GCS motor subscore is 6.     Comments: Fluent speech, no facial droop.  Psychiatric:        Behavior: Behavior normal.     ED Results / Procedures / Treatments   Labs (all labs ordered are listed, but only abnormal results are displayed) Labs Reviewed - No data to display  EKG None  Radiology No results found.  Procedures Procedures (including critical care time)  Medications Ordered in ED Medications - No data to display  ED Course  I have reviewed the triage vital signs and the nursing notes.  Pertinent labs & imaging results that were available during my care of the patient were reviewed by me and  considered in my medical decision making (see chart for details).  Vitals:   11/28/19 1243 11/28/19 1246  BP:  127/68  Pulse:  92  Resp:  17  Temp:  98.6 F (37 C)  SpO2:  97%  Weight: 91.2 kg   Height: 5\' 4"  (1.626 m)  MDM Rules/Calculators/A&P                      Patient seen and examined. Patient presents awake, alert, hemodynamically stable, afebrile, non toxic.  She is very well-appearing.  She has no abdominal tenderness, no peritoneal signs.  Rectal exam performed with chaperone present.  No signs of external hemorrhoid.  No gross melena.  Fecal occult positive.  Labs show no leukocytosis, no anemia. Hemoglobin today 12.1. No electrolyte derangements, no renal insufficiency, normal liver enzymes.  Discussed results with patients.  Given reassuring labs no indications for emergent transfusion or necessary intervention, even though fecal occult positive. Had lengthy discussion about OTC constipation treatment and also the importance of taking iron supplements.  The patient appears reasonably screened and/or stabilized for discharge and I doubt any other medical condition or other [facility] requiring further screening, evaluation, or treatment in the ED at this time prior to discharge. The patient is safe for discharge with strict return precautions discussed. Recommend pcp follow up for symptom recheck.   Portions of this note were generated with L[employer]. Dictation errors may occur despite best attempts at proofreading.    Final Clinical Impression(s) / ED Diagnoses Final diagnoses:  None    Rx / DC Orders ED Discharge Orders    None       Flint Melter 11/28/19 1621    Hayden Rasmussen, MD 11/28/19 2023

## 2019-11-28 NOTE — Discharge Instructions (Signed)
To help reduce constipation and promote bowel health, 1. Drink at least 64 ounces of water each day; 2. Eat plenty of fiber (fruits, vegetables, whole grains, legumes) 3. Get plenty of physical activity  If needed, you may also use daily or as needed, MiraLax (Osmotic Laxative) up to  1-2 times a day and Colace (Stool Softener aka Docusate) 100 mg up to twice a day to help with bowel movements. These medications are over the counter.  MiraLax is an Osmotic You may use other over-the-counter medications such as Dulcolax, Fleet enemas, magnesium citrate as needed for constipation. Please note that some of these medications may cause you to have abdominal cramping which is normal. If you develop severe abdominal pain, fever, vomiting, distention of your abdomen, unable to have a bowel movement for 5 days or are not passing gas, please return to the hospital.  Return to the Emergency Department for any fever, worsening pain, blood in stool, severe abdominal pain, or any other worsening or concerning symptoms.  

## 2019-11-28 NOTE — ED Triage Notes (Signed)
Small amount bright red blood after having a bowel movement x 2 weeks.  Pt reports stools have been hard anddifficulty passing  stool

## 2020-01-30 ENCOUNTER — Ambulatory Visit
Admission: EM | Admit: 2020-01-30 | Discharge: 2020-01-30 | Disposition: A | Discharge status: Home / Self Care | Payer: Self-pay | Attending: Family Medicine | Admitting: Family Medicine

## 2020-01-30 ENCOUNTER — Other Ambulatory Visit: Payer: Self-pay

## 2020-01-30 DIAGNOSIS — N939 Abnormal uterine and vaginal bleeding, unspecified: Secondary | ICD-10-CM | POA: Insufficient documentation

## 2020-01-30 DIAGNOSIS — A64 Unspecified sexually transmitted disease: Secondary | ICD-10-CM | POA: Insufficient documentation

## 2020-01-30 DIAGNOSIS — Z113 Encounter for screening for infections with a predominantly sexual mode of transmission: Secondary | ICD-10-CM | POA: Insufficient documentation

## 2020-01-30 LAB — POCT URINALYSIS DIP (MANUAL ENTRY)
Bilirubin, UA: NEGATIVE
Glucose, UA: NEGATIVE mg/dL
Ketones, POC UA: NEGATIVE mg/dL
Nitrite, UA: NEGATIVE
Protein Ur, POC: NEGATIVE mg/dL
Spec Grav, UA: 1.02 (ref 1.010–1.025)
Urobilinogen, UA: 1 E.U./dL
pH, UA: 7 (ref 5.0–8.0)

## 2020-01-30 LAB — POCT URINE PREGNANCY: Preg Test, Ur: NEGATIVE

## 2020-01-30 NOTE — Discharge Instructions (Signed)
Do not have sex for 7 days, until results are back and negative, or until treatment is complete if needed.   Your STD tests are pending.  If your test results are positive, we will call you.  You may need additional treatment and your partner(s) may also need treatment.

## 2020-01-30 NOTE — ED Triage Notes (Signed)
Pt presents with c/o abnormal periods, pt is taking megase and has had hysteroscopy as well. Pt also wants std screening , denies symptoms

## 2020-01-31 ENCOUNTER — Telehealth: Payer: Self-pay | Admitting: Emergency Medicine

## 2020-01-31 LAB — CERVICOVAGINAL ANCILLARY ONLY
Bacterial Vaginitis (gardnerella): POSITIVE — AB
Candida Glabrata: NEGATIVE
Candida Vaginitis: NEGATIVE
Chlamydia: POSITIVE — AB
Comment: NEGATIVE
Comment: NEGATIVE
Comment: NEGATIVE
Comment: NEGATIVE
Comment: NEGATIVE
Comment: NORMAL
Neisseria Gonorrhea: NEGATIVE
Trichomonas: NEGATIVE

## 2020-01-31 LAB — RPR: RPR Ser Ql: NONREACTIVE

## 2020-01-31 LAB — HIV ANTIBODY (ROUTINE TESTING W REFLEX): HIV Screen 4th Generation wRfx: NONREACTIVE

## 2020-01-31 MED ORDER — AZITHROMYCIN 250 MG PO TABS: 1000.0000 mg | ORAL_TABLET | Freq: Once | ORAL | 0 refills | Status: AC

## 2020-01-31 MED ORDER — METRONIDAZOLE 500 MG PO TABS: 500.0000 mg | ORAL_TABLET | Freq: Two times a day (BID) | ORAL | 0 refills | Status: DC

## 2020-01-31 NOTE — Telephone Encounter (Signed)
Pt called and given test results.  Chlamydia is positive.  RX for 1g zithromax sent to pharmacy.  Please refrain from sexual intercourse for 7 days to give the medicine time to work.  Sexual partners need to be notified and tested/treated.    Bacterial vaginosis is positive. Pt needs treatment. Flagyl 500 mg BID x 7 days #14 no refills sent to patients pharmacy of choice.

## 2020-01-31 NOTE — ED Provider Notes (Signed)
[facility]   [id number] 01/30/20 Arrival Time: 1818   CC: CONCERN FOR STD  SUBJECTIVE:  Stacy Mayo is a 24 y.o. female who presents requesting STI screening.  Currently asymptomatic.  Partner asymptomatic.  Last unprotected sexual encounter earlier this week.  Sexually active with 1 female partner.  Denies similar symptoms in the past.    Denies fever, chills, nausea, vomiting, abdominal or pelvic pain, urinary symptoms, vaginal itching, vaginal odor, vaginal bleeding, dyspareunia, vaginal rashes or lesions.      Patient's last menstrual period was 01/23/2020.  ROS: As per HPI.  All other pertinent ROS negative.     Past Medical History:  Diagnosis Date  . BV (bacterial vaginosis) 10/11/2014  . 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 Chlamydia 09/03/2016  . Contraceptive management 10/11/2014  . Menorrhagia with regular cycle 10/11/2014  . Vaginal discharge 10/11/2014  . Weight gain 10/11/2014   Past Surgical History:  Procedure Laterality Date  . NO PAST SURGERIES     No Known Allergies No current facility-administered medications on file prior to encounter.   Current Outpatient Medications on File Prior to Encounter  Medication Sig Dispense Refill  . ferrous sulfate 325 (65 FE) MG tablet Take 325 mg by mouth daily with breakfast.    . megestrol (MEGACE) 40 MG tablet 1 tablet daily 30 tablet 3  . megestrol (MEGACE) 40 MG tablet 1 tablet daily (Patient taking differently: Take 40 mg by mouth daily. ) 30 tablet 11   Social History   Socioeconomic History  . Marital status: Single    Spouse name: Not on file  . Number of children: 0  . Years of education: Not on file  . Highest education level: Not on file  Occupational History  . Not on file  Tobacco Use  . Smoking status: Never Smoker  . Smokeless tobacco: Never Used  Substance and Sexual Activity   . Alcohol use: No  . Drug use: No  . Sexual activity: Not Currently    Birth control/protection: None  Other Topics Concern  . Not on file  Social History Narrative  . Not on file   Social Determinants of Health   Financial Resource Strain:   . Difficulty of Paying Living Expenses:   Food Insecurity:   . Worried About Programme researcher, broadcasting/film/video in the Last Year:   . Barista in the Last Year:   Transportation Needs:   . Freight forwarder (Medical):   Marland Kitchen Lack of Transportation (Non-Medical):   Physical Activity:   . Days of Exercise per Week:   . Minutes of Exercise per Session:   Stress:   . Feeling of Stress :   Social Connections:   . Frequency of Communication with Friends and Family:   . Frequency of Social Gatherings with Friends and Family:   . Attends Religious Services:   . Active Member of Clubs or Organizations:   . Attends Banker Meetings:   Marland Kitchen Marital Status:   Intimate Partner Violence:   . Fear of Current or Ex-Partner:   . Emotionally Abused:   Marland Kitchen Physically Abused:   . Sexually Abused:    Family History  Problem Relation Age of Onset  . Hypertension Maternal Grandmother   . Diabetes Maternal Grandfather   . Heart failure Paternal Grandmother   . Lung disease Paternal Grandmother  OBJECTIVE:  Vitals:   01/30/20 1837  BP: 124/82  Pulse: 95  Resp: 18  Temp: 98.9 F (37.2 C)  SpO2: 98%     General appearance: alert, NAD, appears stated age Head: NCAT Throat: lips, mucosa, and tongue normal; teeth and gums normal Lungs: CTA bilaterally without adventitious breath sounds Heart: regular rate and rhythm.  Radial pulses 2+ symmetrical bilaterally Back: no CVA tenderness Abdomen: soft, non-tender; bowel sounds normal; no masses or organomegaly; no guarding or rebound tenderness GU: deferred Skin: warm and dry Psychological:  Alert and cooperative. Normal mood and affect.  LABS:  Results for orders placed or performed  during the hospital encounter of 01/30/20  HIV Antibody (routine testing w rflx)  Result Value Ref Range   HIV Screen 4th Generation wRfx Non Reactive Non Reactive  RPR  Result Value Ref Range   RPR Ser Ql Non Reactive Non Reactive  POCT urinalysis dipstick  Result Value Ref Range   Color, UA yellow yellow   Clarity, UA clear clear   Glucose, UA negative negative mg/dL   Bilirubin, UA negative negative   Ketones, POC UA negative negative mg/dL   Spec Grav, UA 1.020 1.010 - 1.025   Blood, UA large (A) negative   pH, UA 7.0 5.0 - 8.0   Protein Ur, POC negative negative mg/dL   Urobilinogen, UA 1.0 0.2 or 1.0 E.U./dL   Nitrite, UA Negative Negative   Leukocytes, UA Small (1+) (A) Negative  POCT urine pregnancy  Result Value Ref Range   Preg Test, Ur Negative Negative  Cervicovaginal ancillary only  Result Value Ref Range   Neisseria Gonorrhea Negative    Chlamydia Positive (A)    Trichomonas Negative    Bacterial Vaginitis (gardnerella) Positive (A)    Candida Vaginitis Negative    Candida Glabrata Negative    Comment      Normal Reference Range Bacterial Vaginosis - Negative   Comment Normal Reference Range Candida Species - Negative    Comment Normal Reference Range Candida Galbrata - Negative    Comment Normal Reference Range Trichomonas - Negative    Comment Normal Reference Ranger Chlamydia - Negative    Comment      Normal Reference Range Neisseria Gonorrhea - Negative    Labs Reviewed  POCT URINALYSIS DIP (MANUAL ENTRY) - Abnormal; Notable for the following components:      Result Value   Blood, UA large (*)    Leukocytes, UA Small (1+) (*)    All other components within normal limits  CERVICOVAGINAL ANCILLARY ONLY - Abnormal; Notable for the following components:   Chlamydia Positive (*)    Bacterial Vaginitis (gardnerella) Positive (*)    All other components within normal limits  HIV ANTIBODY (ROUTINE TESTING W REFLEX)   Narrative:    Performed at:  [facility] [address]  [facility] [address] [place], [place]  [id number] Lab Director: Rush Farmer MD, Phone:  [phone]  RPR   Narrative:    Performed at:  [address] [place], [place]  [id number] Lab Director: Rush Farmer MD, Phone:  [phone]  POCT URINE PREGNANCY    ASSESSMENT & PLAN:  1. STD (female)   2. Abnormal vaginal bleeding   3. Screen for STD (sexually transmitted disease)     No orders of the defined types were placed in this encounter.   Pending: Labs Reviewed  POCT URINALYSIS DIP (MANUAL ENTRY) - Abnormal; Notable for the following components:  Result Value   Blood, UA large (*)    Leukocytes, UA Small (1+) (*)    All other components within normal limits  CERVICOVAGINAL ANCILLARY ONLY - Abnormal; Notable for the following components:   Chlamydia Positive (*)    Bacterial Vaginitis (gardnerella) Positive (*)    All other components within normal limits  HIV ANTIBODY (ROUTINE TESTING W REFLEX)   Narrative:    Performed at:  [address] [place] [address], [place], [place]  [id number] Lab Director: Jolene Schimke MD, Phone:  [phone]  RPR   Narrative:    Performed at:  [address] [place], [place]  [id number] Lab Director: Jolene Schimke MD, Phone:  [phone]  POCT URINE PREGNANCY    Abnormal vaginal bleeding, increased, followed by Dr Emelda Fear for this, taking Megace and has previously had hysteroscopy. Vaginal self swab cytology obtained  HIV/ syphilis testing today Prescribed diflucan 200 mg once daily and then second dose 72 hours later Take medications as prescribed and to completion We will follow up with you regarding the results of your test If tests are positive, please abstain from sexual activity until you and your partner(s) are treated Follow up with PCP or [facility] if symptoms persists Return here or go to ER if you have any new or worsening symptoms    Reviewed  expectations re: course of current medical issues. Questions answered. Outlined signs and symptoms indicating need for more acute intervention. Patient verbalized understanding. After Visit Summary given.        Moshe Cipro, NP 01/31/20 [phone]

## 2020-04-22 ENCOUNTER — Other Ambulatory Visit: Payer: Self-pay | Admitting: Adult Health

## 2020-12-31 ENCOUNTER — Encounter: Payer: Self-pay | Admitting: Emergency Medicine

## 2020-12-31 ENCOUNTER — Other Ambulatory Visit: Payer: Self-pay

## 2020-12-31 ENCOUNTER — Ambulatory Visit
Admission: EM | Admit: 2020-12-31 | Discharge: 2020-12-31 | Disposition: A | Discharge status: Home / Self Care | Payer: 59 | Attending: Internal Medicine | Admitting: Internal Medicine

## 2020-12-31 DIAGNOSIS — N926 Irregular menstruation, unspecified: Secondary | ICD-10-CM | POA: Diagnosis not present

## 2020-12-31 DIAGNOSIS — N924 Excessive bleeding in the premenopausal period: Secondary | ICD-10-CM | POA: Insufficient documentation

## 2020-12-31 LAB — POCT URINE PREGNANCY: Preg Test, Ur: NEGATIVE

## 2020-12-31 NOTE — ED Provider Notes (Signed)
[facility]    CSN: [id number] Arrival date & time: 12/31/20  0851      History   Chief Complaint Chief Complaint  Patient presents with  . Vaginal Bleeding    HPI Stacy Mayo is a 25 y.o. female who presents  With since 3/8 bleeding since and got lighter and got heavier.  She d/c the oral  birth control 03/2020  Last sexual encounter 2-3 month ago, with condom.  Has not done a pregnancy test.  In the past since on oral contraceptive been having irregular periods. Used to be regular prior to that. Has had normal pelvic ultrasound and her last thyroid test one year ago was norma. Has past hx of anemia and has not been taking her iron in the past year.     Past Medical History:  Diagnosis Date  . BV (bacterial vaginosis) 10/11/2014  . 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 Chlamydia 09/03/2016  . Contraceptive management 10/11/2014  . Menorrhagia with regular cycle 10/11/2014  . Vaginal discharge 10/11/2014  . Weight gain 10/11/2014    Patient Active Problem List   Diagnosis Date Noted  . 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 Encounter for gynecological examination with Papanicolaou smear of cervix 04/19/2019  . Irregular periods 04/19/2019  . Chlamydia 09/03/2016  . Menorrhagia with regular cycle 10/11/2014  . Vaginal discharge 10/11/2014  . BV (bacterial vaginosis) 10/11/2014  . Contraceptive management 10/11/2014  . Weight gain 10/11/2014    Past Surgical History:  Procedure Laterality Date  . NO PAST SURGERIES      OB History    Gravida  0   Para  0   Term  0   Preterm  0   AB  0   Living  0     SAB  0   IAB  0   Ectopic  0   Multiple  0   Live Births               Home Medications    Prior to Admission medications   Medication Sig Start Date End Date Taking? Authorizing  Provider  ferrous sulfate 325 (65 FE) MG tablet Take 325 mg by mouth daily with breakfast.    [provider]  megestrol (MEGACE) 40 MG tablet 1 tablet daily 07/04/19   Lazaro Arms, MD  megestrol (MEGACE) 40 MG tablet 1 tablet daily Patient taking differently: Take 40 mg by mouth daily.  10/05/19   Lazaro Arms, MD  metroNIDAZOLE (FLAGYL) 500 MG tablet Take 1 tablet (500 mg total) by mouth 2 (two) times daily. 01/31/20   Eustace Moore, MD    Family History Family History  Problem Relation Age of Onset  . Hypertension Maternal Grandmother   . Diabetes Maternal Grandfather   . Heart failure Paternal Grandmother   . Lung disease Paternal Grandmother     Social History Social History   Tobacco Use  . Smoking status: Never Smoker  . Smokeless tobacco: Never Used  Vaping Use  . Vaping Use: Never used  Substance Use Topics  . Alcohol use: No  . Drug use: No     Allergies   Patient has no known allergies.   Review of Systems Review of Systems  Constitutional: Positive for fatigue. Negative for fever.  Respiratory: Negative for chest tightness and shortness  of breath.   Cardiovascular: Negative for chest pain and leg swelling.  Gastrointestinal: Negative for abdominal pain and nausea.  Genitourinary: Positive for menstrual problem and vaginal bleeding. Negative for dysuria, pelvic pain and vaginal discharge.  Skin: Negative for rash.  Neurological: Negative for dizziness and weakness.     Physical Exam Triage Vital Signs ED Triage Vitals  Enc Vitals Group     BP 12/31/20 0908 107/70     Pulse Rate 12/31/20 0908 83     Resp 12/31/20 0908 19     Temp 12/31/20 0908 99 F (37.2 C)     Temp Source 12/31/20 0908 Oral     SpO2 12/31/20 0908 97 %     Weight --      Height --      Head Circumference --      Peak Flow --      Pain Score 12/31/20 0906 0     Pain Loc --      Pain Edu? --      Excl. in GC? --    No data found.  Updated Vital Signs BP  107/70 (BP Location: Right Arm)   Pulse 83   Temp 99 F (37.2 C) (Oral)   Resp 19   LMP 12/03/2020   SpO2 97%   Visual Acuity Right Eye Distance:   Left Eye Distance:   Bilateral Distance:    Right Eye Near:   Left Eye Near:    Bilateral Near:     Physical Exam Constitutional:      General: She is not in acute distress.    Appearance: She is obese. She is not toxic-appearing.  HENT:     Head: Normocephalic.     Right Ear: External ear normal.     Left Ear: External ear normal.  Eyes:     Extraocular Movements: Extraocular movements intact.     Comments: Conjunctivas are slightly pale  Cardiovascular:     Rate and Rhythm: Normal rate and regular rhythm.  Pulmonary:     Effort: Pulmonary effort is normal.  Abdominal:     General: Bowel sounds are normal.     Palpations: Abdomen is soft.     Tenderness: There is no abdominal tenderness. There is no guarding or rebound.     Hernia: No hernia is present.  Musculoskeletal:        General: Normal range of motion.     Cervical back: Neck supple.  Lymphadenopathy:     Cervical: No cervical adenopathy.  Skin:    General: Skin is warm and dry.     Findings: No rash.  Neurological:     Mental Status: She is alert and oriented to person, place, and time.     Gait: Gait normal.  Psychiatric:        Mood and Affect: Mood normal.        Behavior: Behavior normal.        Thought Content: Thought content normal.        Judgment: Judgment normal.      UC Treatments / Results  Labs (all labs ordered are listed, but only abnormal results are displayed) Labs Reviewed  CBC  TSH  POCT URINE PREGNANCY  CERVICOVAGINAL ANCILLARY ONLY   Urine pregnancy test is neg  EKG   Radiology No results found.  Procedures Procedures (including critical care time)  Medications Ordered in UC Medications - No data to display  Initial Impression / Assessment and Plan / UC Course  I have reviewed the triage vital signs and the  nursing notes. Pertinent labs  results that were available during my care of the patient were reviewed by me and considered in my medical decision making (see chart for details). Has menorrhagia and in the mean time was told to start back on her Iron 325 mg qd, depending on he H/H results we will see if she will need to increase it.  CBC and TSH was ordered and she needs to FU with PCP.  STD test also ordered, but she declined HIV.   Final Clinical Impressions(s) / UC Diagnoses   Final diagnoses:  Excessive bleeding in premenopausal period     Discharge Instructions     Continue taking the Irone 325 mg daily to prevent anemia, if it shows you are anemic we may need you to increase it to 2 times or 3 times a day, depending on the number. Please follow up with your family doctor or gynecologist. Check on your labs via Mychart, if abnormal, we will call you.     ED Prescriptions    None     PDMP not reviewed this encounter.   Garey Ham, PA-C 12/31/20 1601

## 2020-12-31 NOTE — ED Triage Notes (Addendum)
Reports she has been on her menstrual cycle since 03/08.   States it slows down like she is going to stop and then she starts back up again.  Pt has had this issue in the past was put on megace.  States the megace did work for a while.

## 2020-12-31 NOTE — Discharge Instructions (Addendum)
Continue taking the Irone 325 mg daily to prevent anemia, if it shows you are anemic we may need you to increase it to 2 times or 3 times a day, depending on the number. Please follow up with your family doctor or gynecologist. Check on your labs via Mychart, if abnormal, we will call you.

## 2021-01-01 ENCOUNTER — Telehealth (HOSPITAL_COMMUNITY): Payer: Self-pay | Admitting: Emergency Medicine

## 2021-01-01 LAB — CERVICOVAGINAL ANCILLARY ONLY
Bacterial Vaginitis (gardnerella): POSITIVE — AB
Candida Glabrata: NEGATIVE
Candida Vaginitis: NEGATIVE
Chlamydia: NEGATIVE
Comment: NEGATIVE
Comment: NEGATIVE
Comment: NEGATIVE
Comment: NEGATIVE
Comment: NEGATIVE
Comment: NORMAL
Neisseria Gonorrhea: NEGATIVE
Trichomonas: NEGATIVE

## 2021-01-01 LAB — CBC
Hematocrit: 32.2 % — ABNORMAL LOW (ref 34.0–46.6)
Hemoglobin: 10.8 g/dL — ABNORMAL LOW (ref 11.1–15.9)
MCH: 29.7 pg (ref 26.6–33.0)
MCHC: 33.5 g/dL (ref 31.5–35.7)
MCV: 89 fL (ref 79–97)
Platelets: 280 10*3/uL (ref 150–450)
RBC: 3.64 x10E6/uL — ABNORMAL LOW (ref 3.77–5.28)
RDW: 13.5 % (ref 11.7–15.4)
WBC: 4.6 10*3/uL (ref 3.4–10.8)

## 2021-01-01 LAB — TSH: TSH: 1.84 u[IU]/mL (ref 0.450–4.500)

## 2021-01-01 MED ORDER — METRONIDAZOLE 500 MG PO TABS: 500.0000 mg | ORAL_TABLET | Freq: Two times a day (BID) | ORAL | 0 refills | Status: DC

## 2021-01-13 ENCOUNTER — Ambulatory Visit (INDEPENDENT_AMBULATORY_CARE_PROVIDER_SITE_OTHER): Payer: 59 | Admitting: Adult Health

## 2021-01-13 ENCOUNTER — Encounter: Payer: Self-pay | Admitting: Adult Health

## 2021-01-13 ENCOUNTER — Other Ambulatory Visit: Payer: Self-pay

## 2021-01-13 VITALS — BP 113/73 | HR 90 | Ht 65.5 in | Wt 221.1 lb

## 2021-01-13 DIAGNOSIS — N939 Abnormal uterine and vaginal bleeding, unspecified: Secondary | ICD-10-CM | POA: Diagnosis not present

## 2021-01-13 DIAGNOSIS — Z319 Encounter for procreative management, unspecified: Secondary | ICD-10-CM

## 2021-01-13 DIAGNOSIS — D500 Iron deficiency anemia secondary to blood loss (chronic): Secondary | ICD-10-CM | POA: Diagnosis not present

## 2021-01-13 DIAGNOSIS — N926 Irregular menstruation, unspecified: Secondary | ICD-10-CM | POA: Diagnosis not present

## 2021-01-13 MED ORDER — PRENATAL PLUS 27-1 MG PO TABS: 1.0000 | ORAL_TABLET | Freq: Every day | ORAL | 0 refills | Status: DC

## 2021-01-13 NOTE — Progress Notes (Signed)
  Subjective:     Patient ID: Stacy Mayo, female   DOB: 07-05-1996, 25 y.o.   MRN: [id number]  HPI Stacy Mayo is a 25 year old black female,single, G0P0, in follow up of having bleeding 3/8-4/14/22, was seen at Urgent Care 12/31/20 and treated for BV, HGB was 10.8, started iron back. PMP was 2/1-11/05/20 and was normal, she skipped January and period in December was normal 12/21-/09/22/20. She would like to get pregnant. She had normal pelvic US 10/05/2019. Last pap was 04/19/2019, negative with +HPV,did not follow up get pap 2021. No current PCP.  Review of Systems +irregular periods +abnormal bleeding Reviewed past medical,surgical, social and family history. Reviewed medications and allergies.      Objective:   Physical Exam BP 113/73 (BP Location: Right Arm, Patient Position: Sitting, Cuff Size: Normal)   Pulse 90   Ht 5' 5.5" (1.664 m)   Wt 221 lb 1.6 oz (100.3 kg)   LMP 12/03/2020 (Exact Date) Comment: 3/05-01-13  BMI 36.23 kg/m  Skin warm and dry. Lungs: clear to ausculation bilaterally. Cardiovascular: regular rate and rhythm.     Upstream - 01/13/21 1619      Pregnancy Intention Screening   Does the patient want to become pregnant in the next year? Yes    Does the patient's partner want to become pregnant in the next year? Yes    Would the patient like to discuss contraceptive options today? No      Contraception Wrap Up   Current Method Pregnant/Seeking Pregnancy    End Method Pregnant/Seeking Pregnancy    Contraception Counseling Provided No           Upstream - 01/13/21 1619      Pregnancy Intention Screening   Does the patient want to become pregnant in the next year? Yes    Does the patient's partner want to become pregnant in the next year? Yes    Would the patient like to discuss contraceptive options today? No      Contraception Wrap Up   Current Method Pregnant/Seeking Pregnancy    End Method Pregnant/Seeking Pregnancy    Contraception Counseling Provided  No          Assessment:     1. Abnormal uterine bleeding (AUB  2. Irregular periods  3. Iron deficiency anemia due to chronic blood loss Continue iron  4. Patient desires pregnancy She is aware that she may not be ovulating regularly Will rx PNV Meds ordered this encounter  Medications  . prenatal vitamin w/FE, FA (PRENATAL 1 + 1) 27-1 MG TABS tablet    Sig: Take 1 tablet by mouth daily at 12 noon.    Dispense:  30 tablet    Refill:  0    Order Specific Question:   Supervising Provider    Answer:   Lazaro Arms [[id number]]      Plan:     Return 02/03/21 at 11 am for pap and physical with me  Will check progesterone level day 21 of next period

## 2021-02-03 ENCOUNTER — Other Ambulatory Visit (HOSPITAL_COMMUNITY)
Admission: RE | Admit: 2021-02-03 | Discharge: 2021-02-03 | Disposition: A | Discharge status: Home / Self Care | Payer: 59 | Source: Ambulatory Visit | Attending: Adult Health | Admitting: Adult Health

## 2021-02-03 ENCOUNTER — Encounter: Payer: Self-pay | Admitting: Adult Health

## 2021-02-03 ENCOUNTER — Other Ambulatory Visit: Payer: Self-pay

## 2021-02-03 ENCOUNTER — Ambulatory Visit (INDEPENDENT_AMBULATORY_CARE_PROVIDER_SITE_OTHER): Payer: 59 | Admitting: Adult Health

## 2021-02-03 VITALS — BP 114/74 | HR 84 | Ht 64.0 in | Wt 221.0 lb

## 2021-02-03 DIAGNOSIS — Z01419 Encounter for gynecological examination (general) (routine) without abnormal findings: Secondary | ICD-10-CM | POA: Insufficient documentation

## 2021-02-03 DIAGNOSIS — Z319 Encounter for procreative management, unspecified: Secondary | ICD-10-CM | POA: Diagnosis not present

## 2021-02-03 DIAGNOSIS — Z8742 Personal history of other diseases of the female genital tract: Secondary | ICD-10-CM

## 2021-02-03 NOTE — Progress Notes (Addendum)
Patient ID: Stacy Mayo, female   DOB: 04/15/1996, 25 y.o.   MRN: [id number] History of Present Illness:  Stacy Mayo is a 25 year old black female,single, G0P0, in for a well woman gyn exam and pap.    Current Medications, Allergies, Past Medical History, Past Surgical History, Family History and Social History were reviewed in [employer] record.     Review of Systems: Patient denies any headaches, hearing loss, fatigue, blurred vision, shortness of breath, chest pain, abdominal pain, problems with bowel movements, urination, or intercourse. No joint pain or mood swings.    Physical Exam:BP 114/74 (BP Location: Right Arm, Patient Position: Sitting, Cuff Size: Normal)   Pulse 84   Ht 5\' 4"  (1.626 m)   Wt 221 lb (100.2 kg)   LMP 02/03/2021   BMI 37.93 kg/m  General:  Well developed, well nourished, no acute distress Skin:  Warm and dry Neck:  Midline trachea, normal thyroid, good ROM, no lymphadenopathy Lungs; Clear to auscultation bilaterally Breast:  No dominant palpable mass, retraction, or nipple discharge,has bilateral nipple rods Cardiovascular: Regular rate and rhythm Abdomen:  Soft, non tender, no hepatosplenomegaly Pelvic:  External genitalia is normal in appearance, no lesions.  The vagina is normal in appearance,has pink to brown blood, no odor. Urethra has no lesions or masses. The cervix is smooth pap with HR HPV genotypign performed.  Uterus is felt to be normal size, shape, and contour.  No adnexal masses or tenderness noted.Bladder is non tender, no masses felt. Rectal: Deferred Extremities/musculoskeletal:  No swelling or varicosities noted, no clubbing or cyanosis Psych:  No mood changes, alert and cooperative,seems happy AA is 2 Fall risk is low PHQ 9 score is 0 GAD 7 Score is 0  Upstream - 02/03/21 1112      Pregnancy Intention Screening   Does the patient want to become pregnant in the next year? Yes    Does the patient's partner want  to become pregnant in the next year? Yes    Would the patient like to discuss contraceptive options today? No      Contraception Wrap Up   Current Method Pregnant/Seeking Pregnancy    End Method Pregnant/Seeking Pregnancy    Contraception Counseling Provided No         Examination chaperoned by 04/05/21.  Impression and Plan: 1. Encounter for well woman exam with routine gynecological exam   2. Encounter for gynecological examination with Papanicolaou smear of cervix Pap sent Physical in 1 year  Pap in 3 if normal  3. Patient desires pregnancy Continue PNV Cal me when bleeding red, will check progesterone level day 21 of cycle,has orders   4. History of abnormal cervical Pap smear Pap sent

## 2021-02-06 LAB — CYTOLOGY - PAP
Comment: NEGATIVE
Comment: NEGATIVE
Diagnosis: NEGATIVE
HPV 16: NEGATIVE
HPV 18 / 45: NEGATIVE
High risk HPV: POSITIVE — AB

## 2021-02-10 ENCOUNTER — Encounter: Payer: Self-pay | Admitting: Adult Health

## 2021-02-10 ENCOUNTER — Telehealth: Payer: Self-pay | Admitting: Adult Health

## 2021-02-10 NOTE — Telephone Encounter (Signed)
Left message about pap having +HPV and need for colpo, please call for colpo appt.

## 2021-03-11 ENCOUNTER — Other Ambulatory Visit: Payer: Self-pay

## 2021-03-11 ENCOUNTER — Encounter: Payer: Self-pay | Admitting: Women's Health

## 2021-03-11 ENCOUNTER — Ambulatory Visit (INDEPENDENT_AMBULATORY_CARE_PROVIDER_SITE_OTHER): Payer: 59 | Admitting: Women's Health

## 2021-03-11 ENCOUNTER — Other Ambulatory Visit (HOSPITAL_COMMUNITY)
Admission: RE | Admit: 2021-03-11 | Discharge: 2021-03-11 | Disposition: A | Discharge status: Home / Self Care | Payer: 59 | Source: Ambulatory Visit | Attending: Obstetrics & Gynecology | Admitting: Obstetrics & Gynecology

## 2021-03-11 VITALS — BP 113/73 | HR 79 | Ht 64.0 in | Wt 221.4 lb

## 2021-03-11 DIAGNOSIS — Z789 Other specified health status: Secondary | ICD-10-CM

## 2021-03-11 DIAGNOSIS — R87810 Cervical high risk human papillomavirus (HPV) DNA test positive: Secondary | ICD-10-CM | POA: Diagnosis not present

## 2021-03-11 LAB — POCT URINE PREGNANCY: Preg Test, Ur: NEGATIVE

## 2021-03-11 NOTE — Progress Notes (Signed)
   COLPOSCOPY PROCEDURE NOTE Patient name: Stacy Mayo MRN [id number]  Date of birth: 09/30/1995 Subjective Findings:   Stacy Mayo is a 25 y.o. G0P0000 African American female being seen today for a colposcopy. Indication: Abnormal pap on 02/03/21: NILM w/ HRHPV positive: other (not 16, 18/45)  Prior cytology:  Date Result Procedure  04/19/19 NILM w/ HRHPV positive: other (not 16, 18/45) None  2017 NILM w/ HRHPV not done None          No LMP recorded (lmp unknown). (Menstrual status: Irregular Periods). Contraception: none. Menopausal: no. Hysterectomy: no.   Smoker: no. Immunocompromised: no.   The risks and benefits were explained and informed consent was obtained, and written copy is in chart. Pertinent History Reviewed:   Reviewed past medical,surgical, social, obstetrical and family history.  Reviewed problem list, medications and allergies. Objective Findings & Procedure:   Vitals:   03/11/21 0924  BP: 113/73  Pulse: 79  Weight: 221 lb 6.4 oz (100.4 kg)  Height: 5\' 4"  (1.626 m)  Body mass index is 38 kg/m.  Results for orders placed or performed in visit on 03/11/21 (from the past 24 hour(s))  POCT urine pregnancy   Collection Time: 03/11/21  9:51 AM  Result Value Ref Range   Preg Test, Ur Negative Negative     Time out was performed.  Speculum placed in the vagina, cervix fully visualized. SCJ: fully visualized. Cervix swabbed x 3 with acetic acid.  Acetowhitening present: Yes Cervix: no visible lesions, no mosaicism, no punctation, no abnormal vasculature, and light acetowhite lesion(s) noted at 1 o'clock. Cervical biopsies taken at 1 o'clock. Vagina: vaginal colposcopy not performed Vulva: vulvar colposcopy not performed  Specimens: 1  Complications: none  Chaperone: 03/13/21 Neas    Colposcopic Impression & Plan:   Colpo findings c/w HPV atypia Plan: Post biopsy instructions given, Will notify patient of results when back, and Will base plan of care on  pathology results and ASCCP guidelines  Return in about 1 year (around 03/11/2022) for Pap & physical.  03/13/2022 CNM, WHNP-BC 03/11/2021 10:07 AM

## 2021-03-11 NOTE — Patient Instructions (Addendum)
[employer] Flow app (to track periods/fertile days0 Start prenatal vitamins  https://[email or web address].org/Patients/FAQs/Colposcopy">  Colposcopy, Care After This sheet gives you information about how to care for yourself after your procedure. Your health care provider may also give you more specific instructions. If you have problems or questions, contact your health careprovider. What can I expect after the procedure? If you had a colposcopy without a biopsy, you can expect to feel fine right away after your procedure. However, you may have some spotting of blood for afew days. You can return to your normal activities. If you had a colposcopy with a biopsy, it is common after the procedure to have: Soreness and mild pain. These may last for a few days. Light-headedness. Mild vaginal bleeding or discharge that is dark-colored and grainy. This may last for a few days. The discharge may be caused by a liquid (solution) that was used during the procedure. You may need to wear a sanitary pad during this time. Spotting of blood for at least 48 hours after the procedure. Follow these instructions at home: Medicines Take over-the-counter and prescription medicines only as told by your health care provider. Talk with your health care provider about what type of over-the-counter pain medicine and prescription medicine you can start to take again. It is especially important to talk with your health care provider if you take blood thinners. Activity Limit your physical activity for the first day after your procedure as told by your health care provider. Avoid using douche products, using tampons, or having sex for at least 3 days after the procedure or for as long as told. Return to your normal activities as told by your health care provider. Ask your health care provider what activities are safe for you. General instructions  Drink enough fluid to keep your urine pale yellow. Ask your health care  provider if you may take baths, swim, or use a hot tub. You may take showers. If you use birth control (contraception), continue to use it. Keep all follow-up visits as told by your health care provider. This is important.  Contact a health care provider if: You develop a skin rash. Get help right away if: You bleed a lot from your vagina or pass blood clots. This includes using more than one sanitary pad each hour for 2 hours in a row. You have a fever or chills. You have vaginal discharge that is abnormal, is yellow in color, or smells bad. This could be a sign of infection. You have severe pain or cramps in your lower abdomen that do not go away with medicine. You faint. Summary If you had a colposcopy without a biopsy, you can expect to feel fine right away, but you may have some spotting of blood for a few days. You can return to your normal activities. If you had a colposcopy with a biopsy, it is common to have mild pain for a few days and spotting for 48 hours after the procedure. Avoid using douche products, using tampons, and having sex for at least 3 days after the procedure or for as long as told by your health care provider. Get help right away if you have heavy bleeding, severe pain, or signs of infection. This information is not intended to replace advice given to you by your health care provider. Make sure you discuss any questions you have with your healthcare provider. Document Revised: 09/13/2019 Document Reviewed: 09/13/2019 Elsevier Patient Education  2022 [employer].

## 2021-03-12 ENCOUNTER — Other Ambulatory Visit: Payer: Self-pay | Admitting: Adult Health

## 2021-03-12 LAB — SURGICAL PATHOLOGY

## 2021-03-12 MED ORDER — PRENATAL PLUS 27-1 MG PO TABS: 1.0000 | ORAL_TABLET | Freq: Every day | ORAL | 12 refills | Status: AC

## 2021-03-12 NOTE — Progress Notes (Signed)
Refilled PNV

## 2021-04-08 ENCOUNTER — Ambulatory Visit: Payer: 59 | Admitting: Adult Health

## 2021-04-15 ENCOUNTER — Encounter: Payer: Self-pay | Admitting: Emergency Medicine

## 2021-04-15 ENCOUNTER — Ambulatory Visit
Admission: EM | Admit: 2021-04-15 | Discharge: 2021-04-15 | Disposition: A | Discharge status: Home / Self Care | Payer: 59 | Attending: Urgent Care | Admitting: Urgent Care

## 2021-04-15 ENCOUNTER — Other Ambulatory Visit: Payer: Self-pay

## 2021-04-15 DIAGNOSIS — N926 Irregular menstruation, unspecified: Secondary | ICD-10-CM | POA: Diagnosis not present

## 2021-04-15 LAB — POCT URINE PREGNANCY: Preg Test, Ur: NEGATIVE

## 2021-04-15 NOTE — ED Triage Notes (Signed)
Irregular periods for the past few months .  The last few times it was late pt states she had BV.

## 2021-04-15 NOTE — ED Provider Notes (Signed)
[facility]   MRN: [id number] DOB: Oct 24, 1995  Subjective:   Stacy Mayo is a 25 y.o. female presenting for 2 week history of persistent vaginal bleeding. Has had several months of irregular bleeding. This past episode, has had some mild intermittent pelvic cramps. Patient is sexually active, does not use condoms for protection, has 1 female partner. No concern STI. Has previously used OCP but has not been on it for 2 years. She is attempting pregnancy for the past 2 months.  She does have a gynecologist that she is following up with regularly.  No current facility-administered medications for this encounter.  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   +HPV , negative 16/18, repeat HPV in year per [facility] per 2019 guidelines    Chlamydia 09/03/2016   Contraceptive management 10/11/2014   Menorrhagia with regular cycle 10/11/2014   Vaginal discharge 10/11/2014   Weight gain 10/11/2014     Past Surgical History:  Procedure Laterality Date   NO PAST SURGERIES      Family History  Problem Relation Age of Onset   Heart failure Paternal Grandfather    Heart failure Paternal Grandmother    Lung disease Paternal Grandmother    Hypertension Maternal Grandmother    Diabetes Maternal Grandfather    Heart failure Maternal Grandfather    Hypertension Father    Hypertension Mother     Social History   Tobacco Use   Smoking status: Never   Smokeless tobacco: Never  Vaping Use   Vaping Use: Never used  Substance Use Topics   Alcohol use: No   Drug use: No    ROS   Objective:    Vitals: BP 122/78 (BP Location: Right Arm)   Pulse 71   Temp 98.2 F (36.8 C) (Oral)   Resp 18   SpO2 100%   Physical Exam Constitutional:      General: She is not in acute distress.    Appearance: Normal appearance. She is well-developed. She is obese. She is not ill-appearing, toxic-appearing or diaphoretic.  HENT:     Head: Normocephalic and atraumatic.     Nose: Nose normal.     Mouth/Throat:     Mouth: Mucous membranes are moist.     Pharynx: Oropharynx is clear.  Eyes:     General: No scleral icterus.       Right eye: No discharge.        Left eye: No discharge.     Extraocular Movements: Extraocular movements intact.     Conjunctiva/sclera: Conjunctivae normal.     Pupils: Pupils are equal, round, and reactive to light.  Cardiovascular:     Rate and Rhythm: Normal rate.  Pulmonary:     Effort: Pulmonary effort is normal.  Abdominal:     General: Bowel sounds are normal. There is no distension.     Palpations: Abdomen is soft. There is no mass.     Tenderness: no abdominal tenderness There is no right CVA tenderness, left CVA tenderness, guarding or rebound.  Skin:    General: Skin  is warm and dry.  Neurological:     General: No focal deficit present.     Mental Status: She is alert and oriented to person, place, and time.  Psychiatric:        Mood and Affect: Mood normal.        Behavior: Behavior normal.        Thought Content: Thought content normal.        Judgment: Judgment normal.    Results for orders placed or performed during the hospital encounter of 04/15/21 (from the past 24 hour(s))  POCT urine pregnancy     Status: None   Collection Time: 04/15/21  6:42 PM  Result Value Ref Range   Preg Test, Ur Negative Negative    Assessment and Plan :   PDMP not reviewed this encounter.  1. Irregular menstrual cycle     Patient prefers to avoid use of hormonal medications.  Primarily wanted to confirm if she has any type of gynecologic infection or  pregnancy.  Plans on following up with her primary gynecologist soon as possible. Counseled patient on potential for adverse effects with medications prescribed/recommended today, ER and return-to-clinic precautions discussed, patient verbalized understanding.    Wallis Bamberg, [place] 04/15/21 1911

## 2021-04-16 LAB — CERVICOVAGINAL ANCILLARY ONLY
Bacterial Vaginitis (gardnerella): NEGATIVE
Chlamydia: NEGATIVE
Comment: NEGATIVE
Comment: NEGATIVE
Comment: NEGATIVE
Comment: NORMAL
Neisseria Gonorrhea: NEGATIVE
Trichomonas: NEGATIVE

## 2021-04-23 ENCOUNTER — Other Ambulatory Visit (HOSPITAL_COMMUNITY)
Admission: RE | Admit: 2021-04-23 | Discharge: 2021-04-23 | Disposition: A | Discharge status: Home / Self Care | Payer: 59 | Source: Ambulatory Visit | Attending: Adult Health | Admitting: Adult Health

## 2021-04-23 ENCOUNTER — Other Ambulatory Visit: Payer: Self-pay

## 2021-04-23 ENCOUNTER — Encounter: Payer: Self-pay | Admitting: Adult Health

## 2021-04-23 ENCOUNTER — Ambulatory Visit (INDEPENDENT_AMBULATORY_CARE_PROVIDER_SITE_OTHER): Payer: 59 | Admitting: Adult Health

## 2021-04-23 VITALS — BP 103/71 | HR 78 | Ht 65.5 in | Wt 225.5 lb

## 2021-04-23 DIAGNOSIS — N930 Postcoital and contact bleeding: Secondary | ICD-10-CM | POA: Diagnosis present

## 2021-04-23 MED ORDER — METRONIDAZOLE 0.75 % VA GEL: 1.0000 | Freq: Every day | VAGINAL | 0 refills | Status: DC

## 2021-04-23 NOTE — Progress Notes (Signed)
  Subjective:     Patient ID: Stacy Mayo, female   DOB: 09-27-1996, 25 y.o.   MRN: [id number]  HPI Stacy Mayo is a 25 year old black female,single, G0P0, in complaining of bleeding after sex for about a month, no pain. Was seen at Urgent Care about a week ago. 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 Reviewed medications and allergies.  Objective:   Physical Exam BP 103/71 (BP Location: Left Arm, Patient Position: Sitting, Cuff Size: Large)   Pulse 78   Ht 5' 5.5" (1.664 m)   Wt 225 lb 8 oz (102.3 kg)   LMP 04/01/2021   BMI 36.95 kg/m     Skin warm and dry.Pelvic: external genitalia is normal in appearance no lesions, vagina: pink discharge without odor,urethra has no lesions or masses noted, cervix:smooth, uterus: normal size, shape and contour, non tender, no masses felt, adnexa: no masses or tenderness noted. Bladder is non tender and no masses felt.CV swab obtained Examination chaperoned by Rogelia Boga LPN Fall risk is low  [facility] - 04/23/21 0851       Pregnancy Intention Screening   Does the patient want to become pregnant in the next year? Yes    Does the patient's partner want to become pregnant in the next year? Yes    Would the patient like to discuss contraceptive options today? No      Contraception Wrap Up   Current Method Pregnant/Seeking Pregnancy    End Method Pregnant/Seeking Pregnancy             Assessment:     1. PCB (post coital bleeding) CV swab sent for GC/CHL.trich,BV and yeast Will rx metrogel use at bedtime for 7 days and no sex Meds ordered this encounter  Medications    metroNIDAZOLE (METROGEL VAGINAL) 0.75 % vaginal gel    Sig: Place 1 Applicatorful vaginally at bedtime.    Dispense:  70 g    Refill:  0    Order Specific Question:   Supervising Provider    Answer:   Lazaro Arms [[id number]]       Plan:     Follow up prn

## 2021-04-25 ENCOUNTER — Other Ambulatory Visit: Payer: Self-pay | Admitting: Adult Health

## 2021-04-25 LAB — CERVICOVAGINAL ANCILLARY ONLY
Bacterial Vaginitis (gardnerella): NEGATIVE
Candida Glabrata: NEGATIVE
Candida Vaginitis: POSITIVE — AB
Chlamydia: NEGATIVE
Comment: NEGATIVE
Comment: NEGATIVE
Comment: NEGATIVE
Comment: NEGATIVE
Comment: NEGATIVE
Comment: NORMAL
Neisseria Gonorrhea: NEGATIVE
Trichomonas: NEGATIVE

## 2021-04-25 MED ORDER — FLUCONAZOLE 150 MG PO TABS: ORAL_TABLET | ORAL | 1 refills | Status: DC

## 2021-04-25 NOTE — Progress Notes (Signed)
+  yeast on vaginal swab rx diflucan  

## 2021-07-10 ENCOUNTER — Telehealth: Payer: 59 | Admitting: Physician Assistant

## 2021-07-10 DIAGNOSIS — B9689 Other specified bacterial agents as the cause of diseases classified elsewhere: Secondary | ICD-10-CM | POA: Diagnosis not present

## 2021-07-10 DIAGNOSIS — J038 Acute tonsillitis due to other specified organisms: Secondary | ICD-10-CM | POA: Diagnosis not present

## 2021-07-10 MED ORDER — AMOXICILLIN 500 MG PO CAPS: 500.0000 mg | ORAL_CAPSULE | Freq: Three times a day (TID) | ORAL | 0 refills | Status: AC

## 2021-07-10 NOTE — Progress Notes (Signed)
Virtual Visit Consent   Stacy Mayo, you are scheduled for a virtual visit with a [facility] provider today.     Just as with appointments in the office, your consent must be obtained to participate.  Your consent will be active for this visit and any virtual visit you may have with one of our providers in the next 365 days.     If you have a MyChart account, a copy of this consent can be sent to you electronically.  All virtual visits are billed to your insurance company just like a traditional visit in the office.    As this is a virtual visit, video technology does not allow for your provider to perform a traditional examination.  This may limit your provider's ability to fully assess your condition.  If your provider identifies any concerns that need to be evaluated in person or the need to arrange testing (such as labs, EKG, etc.), we will make arrangements to do so.     Although advances in technology are sophisticated, we cannot ensure that it will always work on either your end or our end.  If the connection with a video visit is poor, the visit may have to be switched to a telephone visit.  With either a video or telephone visit, we are not always able to ensure that we have a secure connection.     I need to obtain your verbal consent now.   Are you willing to proceed with your visit today?    Stacy Mayo has provided verbal consent on 07/10/2021 for a virtual visit (video or telephone).   Stacy Mayo, [place]   Date: 07/10/2021 7:50 PM   Virtual Visit via Video Note   I, Stacy Mayo, connected with  Stacy Mayo  ([id number], [birth year]) on 07/10/21 at  7:45 PM EDT by a video-enabled telemedicine application and verified that I am speaking with the correct person using two identifiers.  Location: Patient: Virtual Visit Location Patient: Home Provider: Virtual Visit Location Provider: Home Office   I discussed the limitations of evaluation and management  by telemedicine and the availability of in person appointments. The patient expressed understanding and agreed to proceed.    History of Present Illness: Stacy Mayo is a 25 y.o. who identifies as a female who was assigned female at birth, and is being seen today for sore throat x 10 days. Initially with sore throat and some associated chest congestion and cough. Notes the URI symptoms resolved last week but now with continued sore throat. States had improved but recurred in the past few days and more significantly swollen and bilateral. Denies any residual fever or chills. Denies any noted nasal congestion or PND, especially since last week. Denies recent travel or any known sick contact. Notes tonsils are swollen and red and noting white patches on each tonsil. Has had negative COVID tests.     HPI: HPI  Problems:  Patient Active Problem List   Diagnosis Date Noted   PCB (post coital bleeding) 04/23/2021   Abnormal uterine bleeding (AUB) 01/13/2021   Iron deficiency anemia due to chronic blood loss 01/13/2021   Papanicolaou smear of cervix with positive high risk human papilloma virus (HPV) test 04/26/2019   Irregular periods 04/19/2019   Chlamydia 09/03/2016   Menorrhagia with regular cycle 10/11/2014   BV (bacterial vaginosis) 10/11/2014   Weight gain 10/11/2014    Allergies: No Known Allergies Medications:  Current Outpatient Medications:    amoxicillin (AMOXIL) 500  MG capsule, Take 1 capsule (500 mg total) by mouth 3 (three) times daily for 10 days., Disp: 20 capsule, Rfl: 0   ferrous sulfate 325 (65 FE) MG tablet, Take 325 mg by mouth daily with breakfast., Disp: , Rfl:    prenatal vitamin w/FE, FA (PRENATAL 1 + 1) 27-1 MG TABS tablet, Take 1 tablet by mouth daily at 12 noon., Disp: 30 tablet, Rfl: 12  Observations/Objective: Patient is well-developed, well-nourished in no acute distress.  Resting comfortably at home.  Head is normocephalic, atraumatic.  No labored  breathing. Speech is clear and coherent with logical content.  Patient is alert and oriented at baseline.   Assessment and Plan: 1. Acute bacterial tonsillitis - amoxicillin (AMOXIL) 500 MG capsule; Take 1 capsule (500 mg total) by mouth 3 (three) times daily for 10 days.  Dispense: 20 capsule; Refill: 0 No alarm signs/symptoms. Positive sore throat, no other URI symptoms, tonsillar exudates. Will treat for bacterial tonsillitis with Amox. Supportive measures and OTC medications reviewed.   Follow Up Instructions: I discussed the assessment and treatment plan with the patient. The patient was provided an opportunity to ask questions and all were answered. The patient agreed with the plan and demonstrated an understanding of the instructions.  A copy of instructions were sent to the patient via MyChart unless otherwise noted below.   The patient was advised to call back or seek an in-person evaluation if the symptoms worsen or if the condition fails to improve as anticipated.  Time:  I spent 10 minutes with the patient via telehealth technology discussing the above problems/concerns.    Stacy Climes, PA-C

## 2021-07-10 NOTE — Patient Instructions (Signed)
Janae Bridgeman, thank you for joining Piedad Climes, PA-C for today's virtual visit.  While this provider is not your primary care provider (PCP), if your PCP is located in our provider database this encounter information will be shared with them immediately following your visit.  Consent: (Patient) Eleanore Junio provided verbal consent for this virtual visit at the beginning of the encounter.  Current Medications:  Current Outpatient Medications:    fluconazole (DIFLUCAN) 150 MG tablet, Take 1 now and 1 in 3 days, Disp: 2 tablet, Rfl: 1   ferrous sulfate 325 (65 FE) MG tablet, Take 325 mg by mouth daily with breakfast., Disp: , Rfl:    metroNIDAZOLE (METROGEL VAGINAL) 0.75 % vaginal gel, Place 1 Applicatorful vaginally at bedtime., Disp: 70 g, Rfl: 0   prenatal vitamin w/FE, FA (PRENATAL 1 + 1) 27-1 MG TABS tablet, Take 1 tablet by mouth daily at 12 noon., Disp: 30 tablet, Rfl: 12   Medications ordered in this encounter:  No orders of the defined types were placed in this encounter.    *If you need refills on other medications prior to your next appointment, please contact your pharmacy*  Follow-Up: Call back or seek an in-person evaluation if the symptoms worsen or if the condition fails to improve as anticipated.  Other Instructions Strep Throat, Adult Strep throat is an infection of the throat. It is caused by germs (bacteria). Strep throat is common during the cold months of the year. It mostly affects children who are 106-52 years old. However, people of all ages can get it at any time of the year. This infection spreads from person to person through coughing, sneezing, or having close contact. What are the causes? This condition is caused by the Streptococcus pyogenes germ. What increases the risk? You care for young children. Children are more likely to get strep throat and may spread it to others. You go to crowded places. Germs can spread easily in such places. You kiss  or touch someone who has strep throat. What are the signs or symptoms? Fever or chills. Redness, swelling, or pain in the tonsils or throat. Pain or trouble when swallowing. White or yellow spots on the tonsils or throat. Tender glands in the neck and under the jaw. Bad breath. Red rash all over the body. This is rare. How is this treated? Medicines that kill germs (antibiotics). Medicines that treat pain or fever. These include: Ibuprofen or acetaminophen. Aspirin, only for people who are over the age of 38. Cough drops. Throat sprays. Follow these instructions at home: Medicines  Take over-the-counter and prescription medicines only as told by your doctor. Take your antibiotic medicine as told by your doctor. Do not stop taking the antibiotic even if you start to feel better. Eating and drinking  If you have trouble swallowing, eat soft foods until your throat feels better. Drink enough fluid to keep your pee (urine) pale yellow. To help with pain, you may have: Warm fluids, such as soup and tea. Cold fluids, such as frozen desserts or popsicles. General instructions Rinse your mouth (gargle) with a salt-water mixture 3-4 times a day or as needed. To make a salt-water mixture, dissolve -1 tsp (3-6 g) of salt in 1 cup (237 mL) of warm water. Rest as much as you can. Stay home from work or school until you have been taking antibiotics for 24 hours. Do not smoke or use any products that contain nicotine or tobacco. If you need help quitting, ask your  doctor. Keep all follow-up visits. How is this prevented?  Do not share food, drinking cups, or personal items. They can cause the germs to spread. Wash your hands well with soap and water. Make sure that all people in your house wash their hands well. Have family members tested if they have a fever or a sore throat. They may need an antibiotic if they have strep throat. Contact a doctor if: You have swelling in your neck that  keeps getting bigger. You get a rash, cough, or earache. You cough up a thick fluid that is green, yellow-brown, or bloody. You have pain that does not get better with medicine. Your symptoms get worse instead of getting better. You have a fever. Get help right away if: You vomit. You have a very bad headache. Your neck hurts or feels stiff. You have chest pain or are short of breath. You have drooling, very bad throat pain, or changes in your voice. Your neck is swollen, or the skin gets red and tender. Your mouth is dry, or you are peeing less than normal. You keep feeling more tired or have trouble waking up. Your joints are red or painful. These symptoms may be an emergency. Do not wait to see if the symptoms will go away. Get help right away. Call your local emergency services (911 in the [place] Summary Strep throat is an infection of the throat. It is caused by germs (bacteria). This infection can spread from person to person through coughing, sneezing, or having close contact. Take your medicines, including antibiotics, as told by your doctor. Do not stop taking the antibiotic even if you start to feel better. To prevent the spread of germs, wash your hands well with soap and water. Have others do the same. Do not share food, drinking cups, or personal items. Get help right away if you have a bad headache, chest pain, shortness of breath, a stiff or painful neck, or you vomit. This information is not intended to replace advice given to you by your health care provider. Make sure you discuss any questions you have with your health care provider. Document Revised: 01/07/2021 Document Reviewed: 01/07/2021 Elsevier Patient Education  2022 [employer].    If you have been instructed to have an in-person evaluation today at a local Urgent Care facility, please use the link below. It will take you to a list of all of our available [facility], including address, phone  number and hours of operation. Please do not delay care.  [facility]  If you or a family member do not have a primary care provider, use the link below to schedule a visit and establish care. When you choose a [facility] primary care physician or advanced practice provider, you gain a long-term partner in health. Find a Primary Care Provider  Learn more about [facility]'s in-office and virtual care options:  - Get Care Now

## 2021-10-08 ENCOUNTER — Ambulatory Visit: Payer: Self-pay | Admitting: Allergy & Immunology

## 2022-02-04 ENCOUNTER — Other Ambulatory Visit: Payer: 59 | Admitting: Adult Health

## 2022-02-05 ENCOUNTER — Other Ambulatory Visit: Payer: 59 | Admitting: Adult Health

## 2022-02-05 ENCOUNTER — Other Ambulatory Visit (HOSPITAL_COMMUNITY)
Admission: RE | Admit: 2022-02-05 | Discharge: 2022-02-05 | Disposition: A | Discharge status: Home / Self Care | Payer: No Typology Code available for payment source | Source: Ambulatory Visit | Attending: Adult Health | Admitting: Adult Health

## 2022-02-05 ENCOUNTER — Ambulatory Visit (INDEPENDENT_AMBULATORY_CARE_PROVIDER_SITE_OTHER): Payer: No Typology Code available for payment source | Admitting: Adult Health

## 2022-02-05 ENCOUNTER — Encounter: Payer: Self-pay | Admitting: Adult Health

## 2022-02-05 VITALS — BP 104/68 | HR 87 | Ht 65.5 in | Wt 233.0 lb

## 2022-02-05 DIAGNOSIS — R87810 Cervical high risk human papillomavirus (HPV) DNA test positive: Secondary | ICD-10-CM | POA: Diagnosis present

## 2022-02-05 DIAGNOSIS — Z01419 Encounter for gynecological examination (general) (routine) without abnormal findings: Secondary | ICD-10-CM

## 2022-02-05 NOTE — Progress Notes (Signed)
Patient ID: Stacy Mayo, female   DOB: 17-Jan-1996, 26 y.o.   MRN: [id number] ?History of Present Illness: ?Stacy Mayo is a 26 year old black female,single, G0P0 in for a well woman gyn exam and pap. Her pap last year +HR HPV other and she had colpo, scant atypical cells. ? ? ? ?Current Medications, Allergies, Past Medical History, Past Surgical History, Family History and Social History were reviewed in [employer] record.   ? ? ?Review of Systems: ?Periods are irregular at times, may skip then may bleed for 2 weeks  ?Patient denies any headaches, hearing loss, fatigue, blurred vision, shortness of breath, chest pain, abdominal pain, problems with bowel movements, urination, or intercourse. No joint pain or mood swings.  ? ? ?Physical Exam:BP 104/68 (BP Location: Right Arm, Patient Position: Sitting, Cuff Size: Normal)   Pulse 87   Ht 5' 5.5" (1.664 m)   Wt 233 lb (105.7 kg)   LMP 01/07/2022   BMI 38.18 kg/m?   ?General:  Well developed, well nourished, no acute distress ?Skin:  Warm and dry ?Neck:  Midline trachea, normal thyroid, good ROM, no lymphadenopathy ?Lungs; Clear to auscultation bilaterally ?Breast:  No dominant palpable mass, retraction, or nipple discharge,has bilateral nipple rods ?Cardiovascular: Regular rate and rhythm ?Abdomen:  Soft, non tender, no hepatosplenomegaly ?Pelvic:  External genitalia is normal in appearance, no lesions.  The vagina is normal in appearance,has white discharge. Urethra has no lesions or masses. The cervix is smooth, pap with GC/CHL and HR HPV genotyping performed.   Uterus is felt to be normal size, shape, and contour.  No adnexal masses or tenderness noted.Bladder is non tender, no masses felt. ?Extremities/musculoskeletal:  No swelling or varicosities noted, no clubbing or cyanosis ?Psych:  No mood changes, alert and cooperative,seems happy ?AA is 1 ?Fall risk is low ? ?  02/05/2022  ? 11:09 AM 02/03/2021  ? 11:13 AM 04/19/2019  ?  9:44 AM   ?Depression screen PHQ 2/9  ?Decreased Interest 0 0 0  ?Down, Depressed, Hopeless 0 0 0  ?PHQ - 2 Score 0 0 0  ?Altered sleeping 0 0 1  ?Tired, decreased energy 0 0 0  ?Change in appetite 0 0 0  ?Feeling bad or failure about yourself  0 0 0  ?Trouble concentrating 0 0 0  ?Moving slowly or fidgety/restless 0 0 0  ?Suicidal thoughts 0 0 0  ?PHQ-9 Score 0 0 1  ?Difficult doing work/chores   Not difficult at all  ?  ? ?  02/05/2022  ? 11:09 AM 02/03/2021  ? 11:14 AM  ?GAD 7 : Generalized Anxiety Score  ?Nervous, Anxious, on Edge 0 0  ?Control/stop worrying 0 0  ?Worry too much - different things 0 0  ?Trouble relaxing 0 0  ?Restless 0 0  ?Easily annoyed or irritable 0 0  ?Afraid - awful might happen 0 0  ?Total GAD 7 Score 0 0  ? ? Upstream - 02/05/22 1109   ? ?  ? Pregnancy Intention Screening  ? Does the patient want to become pregnant in the next year? Yes   ? Does the patient's partner want to become pregnant in the next year? Yes   ? Would the patient like to discuss contraceptive options today? No   ?  ? Contraception Wrap Up  ? Current Method Pregnant/Seeking Pregnancy   ? End Method Pregnant/Seeking Pregnancy   ? Contraception Counseling Provided No   ? ?  ?  ? ?  ?  Examination chaperoned by Faith Rogue LPN ? ?Impression and Plan: ?1. Encounter for routine gynecological examination with Papanicolaou smear of cervix ?Pap sent ?Physical in 1 year ?Pap in 3 years if normal ?Take PNV ?Keep period log ? ?2. Papanicolaou smear of cervix with positive high risk human papilloma virus (HPV) test ?Pap sent ? ? ? ?  ?  ?

## 2022-02-10 LAB — CYTOLOGY - PAP
Adequacy: ABSENT
Chlamydia: NEGATIVE
Comment: NEGATIVE
Comment: NEGATIVE
Comment: NEGATIVE
Comment: NEGATIVE
Comment: NORMAL
Diagnosis: NEGATIVE
HPV 16: NEGATIVE
HPV 18 / 45: NEGATIVE
High risk HPV: POSITIVE — AB
Neisseria Gonorrhea: NEGATIVE

## 2022-02-11 ENCOUNTER — Other Ambulatory Visit: Payer: Self-pay | Admitting: Adult Health

## 2022-02-11 MED ORDER — FLUCONAZOLE 150 MG PO TABS: ORAL_TABLET | ORAL | 1 refills | Status: DC

## 2022-02-11 NOTE — Progress Notes (Signed)
+  yeast on pap will rx diflucan  

## 2022-12-22 ENCOUNTER — Inpatient Hospital Stay (HOSPITAL_COMMUNITY): Payer: PRIVATE HEALTH INSURANCE

## 2022-12-22 ENCOUNTER — Encounter (HOSPITAL_COMMUNITY): Payer: Self-pay

## 2022-12-22 ENCOUNTER — Inpatient Hospital Stay (HOSPITAL_COMMUNITY)
Admission: AD | Admit: 2022-12-22 | Discharge: 2022-12-22 | Disposition: A | Discharge status: Home / Self Care | Payer: PRIVATE HEALTH INSURANCE | Attending: Obstetrics & Gynecology | Admitting: Obstetrics & Gynecology

## 2022-12-22 DIAGNOSIS — Z3A01 Less than 8 weeks gestation of pregnancy: Secondary | ICD-10-CM | POA: Insufficient documentation

## 2022-12-22 DIAGNOSIS — O3680X0 Pregnancy with inconclusive fetal viability, not applicable or unspecified: Secondary | ICD-10-CM | POA: Insufficient documentation

## 2022-12-22 DIAGNOSIS — O26851 Spotting complicating pregnancy, first trimester: Secondary | ICD-10-CM | POA: Insufficient documentation

## 2022-12-22 DIAGNOSIS — N939 Abnormal uterine and vaginal bleeding, unspecified: Secondary | ICD-10-CM | POA: Diagnosis present

## 2022-12-22 LAB — URINALYSIS, ROUTINE W REFLEX MICROSCOPIC
Bilirubin Urine: NEGATIVE
Glucose, UA: NEGATIVE mg/dL
Ketones, ur: NEGATIVE mg/dL
Leukocytes,Ua: NEGATIVE
Nitrite: NEGATIVE
Protein, ur: NEGATIVE mg/dL
RBC / HPF: >50 RBC/hpf (ref 0–5)
Specific Gravity, Urine: 1.025 (ref 1.005–1.030)
pH: 5 (ref 5.0–8.0)

## 2022-12-22 LAB — CBC
HCT: 38.4 % (ref 36.0–46.0)
Hemoglobin: 12.4 g/dL (ref 12.0–15.0)
MCH: 27.8 pg (ref 26.0–34.0)
MCHC: 32.3 g/dL (ref 30.0–36.0)
MCV: 86.1 fL (ref 80.0–100.0)
Platelets: 426 10*3/uL — ABNORMAL HIGH (ref 150–400)
RBC: 4.46 MIL/uL (ref 3.87–5.11)
RDW: 14.1 % (ref 11.5–15.5)
WBC: 7.3 10*3/uL (ref 4.0–10.5)
nRBC: 0 % (ref 0.0–0.2)

## 2022-12-22 LAB — WET PREP, GENITAL
Clue Cells Wet Prep HPF POC: NONE SEEN
Sperm: NONE SEEN
Trich, Wet Prep: NONE SEEN
WBC, Wet Prep HPF POC: <10 (ref <10)
Yeast Wet Prep HPF POC: NONE SEEN

## 2022-12-22 LAB — HCG, QUANTITATIVE, PREGNANCY: hCG, Beta Chain, Quant, S: 217 m[IU]/mL — ABNORMAL HIGH (ref <5)

## 2022-12-22 LAB — ABO/RH: ABO/RH(D): O POS

## 2022-12-22 NOTE — MAU Provider Note (Signed)
History     CSN: [id number]  Arrival date and time: 12/22/22 1205   None     Chief Complaint  Patient presents with   Abdominal Pain   Vaginal Bleeding   HPI Stacy Mayo is a 27 y.o. G1P0 at [redacted]w[redacted]d by LMP who presents to MAU for vaginal spotting. She reports intermittent spotting since Saturday that occurs only with wiping. Spotting turned a dark red color today which concerned her. She denies any abdominal or pelvic pain, itching, odor, or urinary s/s. She reports she has been seen at [facility] but does not have an appointment until next Friday.    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/2019   +HPV , negative 16/18, repeat HPV in year per ASCCP per 2019 guidelines    Chlamydia 09/03/2016   Contraceptive management 10/11/2014   Menorrhagia with regular cycle 10/11/2014   Vaginal discharge 10/11/2014   Weight gain 10/11/2014    Past Surgical History:  Procedure Laterality Date   NO PAST SURGERIES      Family History  Problem Relation Age of Onset   Heart failure Paternal Grandfather    Heart failure Paternal Grandmother    Lung disease Paternal Grandmother    Hypertension Maternal Grandmother    Diabetes Maternal Grandfather    Heart failure Maternal Grandfather    Hypertension Father    Hypertension Mother     Social History   Tobacco Use   Smoking status: Never   Smokeless tobacco: Never  Vaping Use   Vaping Use: Never used  Substance Use Topics   Alcohol use: No   Drug use: No    Allergies: No Known Allergies  Medications Prior to Admission  Medication Sig Dispense Refill Last Dose   ferrous sulfate 325 (65 FE) MG tablet Take 325 mg by mouth daily with  breakfast. (Patient not taking: Reported on 02/05/2022)      fluconazole (DIFLUCAN) 150 MG tablet Take 1 now and 1 in 3 days 2 tablet 1    prenatal vitamin w/FE, FA (PRENATAL 1 + 1) 27-1 MG TABS tablet Take 1 tablet by mouth daily at 12 noon. (Patient not taking: Reported on 02/05/2022) 30 tablet 12    Review of Systems  Genitourinary:  Positive for vaginal bleeding (spotting).  All other systems reviewed and are negative.  Physical Exam   Blood pressure 125/73, pulse 94, temperature 98.4 F (36.9 C), temperature source Oral, resp. rate 14, height 5\' 4"  (1.626 m), weight 106.7 kg, last menstrual period 11/15/2022, SpO2 100 %.  Physical Exam Vitals and nursing note reviewed.  Constitutional:      General: She is not in acute distress.    Appearance: She is obese.  Eyes:     Extraocular Movements: Extraocular movements intact.     Pupils: Pupils are equal, round, and reactive to light.  Cardiovascular:     Rate and Rhythm: Normal rate.  Pulmonary:     Effort: Pulmonary effort is normal. No  respiratory distress.  Genitourinary:    Comments: Patient self swabbed Skin:    General: Skin is warm and dry.  Neurological:     General: No focal deficit present.     Mental Status: She is alert and oriented to person, place, and time.  Psychiatric:        Mood and Affect: Mood normal.        Behavior: Behavior normal.   Results for orders placed or performed during the hospital encounter of 12/22/22 (from the past 24 hour(s))  Urinalysis, Routine w reflex microscopic -Urine, Clean Catch     Status: Abnormal   Collection Time: 12/22/22 12:51 PM  Result Value Ref Range   Color, Urine YELLOW YELLOW   APPearance HAZY (A) CLEAR   Specific Gravity, Urine 1.025 1.005 - 1.030   pH 5.0 5.0 - 8.0   Glucose, UA NEGATIVE NEGATIVE mg/dL   Hgb urine dipstick LARGE (A) NEGATIVE   Bilirubin Urine NEGATIVE NEGATIVE   Ketones, ur NEGATIVE NEGATIVE mg/dL   Protein, ur NEGATIVE NEGATIVE mg/dL   Nitrite  NEGATIVE NEGATIVE   Leukocytes,Ua NEGATIVE NEGATIVE   RBC / HPF >50 0 - 5 RBC/hpf   WBC, UA 0-5 0 - 5 WBC/hpf   Bacteria, UA RARE (A) NONE SEEN   Squamous Epithelial / HPF 6-10 0 - 5 /HPF   Mucus PRESENT   Wet prep, genital     Status: None   Collection Time: 12/22/22 12:55 PM  Result Value Ref Range   Yeast Wet Prep HPF POC NONE SEEN NONE SEEN   Trich, Wet Prep NONE SEEN NONE SEEN   Clue Cells Wet Prep HPF POC NONE SEEN NONE SEEN   WBC, Wet Prep HPF POC <10 <10   Sperm NONE SEEN   CBC     Status: Abnormal   Collection Time: 12/22/22 12:57 PM  Result Value Ref Range   WBC 7.3 4.0 - 10.5 K/uL   RBC 4.46 3.87 - 5.11 MIL/uL   Hemoglobin 12.4 12.0 - 15.0 g/dL   HCT 38.4 36.0 - 46.0 %   MCV 86.1 80.0 - 100.0 fL   MCH 27.8 26.0 - 34.0 pg   MCHC 32.3 30.0 - 36.0 g/dL   RDW 14.1 11.5 - 15.5 %   Platelets 426 (H) 150 - 400 K/uL   nRBC 0.0 0.0 - 0.2 %  hCG, quantitative, pregnancy     Status: Abnormal   Collection Time: 12/22/22 12:57 PM  Result Value Ref Range   hCG, Beta Chain, Quant, S 217 (H) <5 mIU/mL  ABO/Rh     Status: None   Collection Time: 12/22/22 12:57 PM  Result Value Ref Range   ABO/RH(D) O POS    No rh immune globuloin      NOT A RH IMMUNE GLOBULIN CANDIDATE, PT RH POSITIVE Performed at [facility], [address], [place], [place] [address]    US OB LESS THAN 14 WEEKS WITH [place] TRANSVAGINAL  Result Date: 12/22/2022 CLINICAL DATA:  Vaginal bleeding. Quantitative HCG pending at the time of this dictation. EXAM: OBSTETRIC <14 WK [place] AND TRANSVAGINAL OB US TECHNIQUE: Both transabdominal and transvaginal ultrasound examinations were performed for complete evaluation of the gestation as well as the maternal uterus, adnexal regions, and pelvic cul-de-sac. Transvaginal technique was performed to assess early pregnancy. COMPARISON:  None Available. FINDINGS: Intrauterine gestational sac: None. Yolk sac:  Not visualized Embryo:  Not visualized. Cardiac Activity: Not  applicable. Subchorionic hemorrhage:  None visualized. Maternal uterus/adnexae:  Negative. Small volume of free pelvic fluid noted. IMPRESSION: Negative for ultrasound evidence of pregnancy could be due to early IUP or failed pregnancy. Recommend correlation with quantitative HCG and repeat ultrasound as indicated. Electronically Signed   By: Inge Rise M.D.   On: 12/22/2022 13:47    MAU Course  Procedures  MDM UA and wet prep negative. GC/CT pending. CBC stable. Blood type O positive, Rhogam not indicated. HCG 217.  [place] without evidence of IUP or ectopic. Ectopic precautions given.   Assessment and Plan   1. Pregnancy of unknown anatomic location   2. [redacted] weeks gestation of pregnancy    - Discharge home in stable condition - Repeat bHCG in 48 hours at [facility] - Return precautions given. Return to MAU as needed for new/worsening symptoms - List of OBGYN's provided per patient request  Renee Harder, CNM 12/22/2022, 7:47 PM

## 2022-12-22 NOTE — Discharge Instructions (Signed)
Safe Medications in Pregnancy    Acne: Benzoyl Peroxide Salicylic Acid  Backache/Headache: Tylenol: 2 regular strength every 4 hours OR              2 Extra strength every 6 hours  Colds/Coughs/Allergies: Benadryl (alcohol free) 25 mg every 6 hours as needed Breath right strips Claritin Cepacol throat lozenges Chloraseptic throat spray Cold-Eeze- up to three times per day Cough drops, alcohol free Flonase (by prescription only) Guaifenesin Mucinex Robitussin DM (plain only, alcohol free) Saline nasal spray/drops Sudafed (pseudoephedrine) & Actifed ** use only after [redacted] weeks gestation and if you do not have high blood pressure Tylenol Vicks Vaporub Zinc lozenges Zyrtec   Constipation: Colace Ducolax suppositories Fleet enema Glycerin suppositories Metamucil Milk of magnesia Miralax Senokot Smooth move tea  Diarrhea: Kaopectate Imodium A-D  *NO pepto Bismol  Hemorrhoids: Anusol Anusol HC Preparation H Tucks  Indigestion: Tums Maalox Mylanta Zantac  Pepcid  Insomnia: Benadryl (alcohol free) 25mg every 6 hours as needed Tylenol PM Unisom, no Gelcaps  Leg Cramps: Tums MagGel  Nausea/Vomiting:  Bonine Dramamine Emetrol Ginger extract Sea bands Meclizine  Nausea medication to take during pregnancy:  Unisom (doxylamine succinate 25 mg tablets) Take one tablet daily at bedtime. If symptoms are not adequately controlled, the dose can be increased to a maximum recommended dose of two tablets daily (1/2 tablet in the morning, 1/2 tablet mid-afternoon and one at bedtime). Vitamin B6 100mg tablets. Take one tablet twice a day (up to 200 mg per day).  Skin Rashes: Aveeno products Benadryl cream or 25mg every 6 hours as needed Calamine Lotion 1% cortisone cream  Yeast infection: Gyne-lotrimin 7 Monistat 7   **If taking multiple medications, please check labels to avoid duplicating the same active ingredients **take  medication as directed on the label ** Do not exceed 4000 mg of tylenol in 24 hours **Do not take medications that contain aspirin or ibuprofen   [facility]iders   [facility]             [address], [place], [place] [address] [phone]  [facility]                                                             [address], [place], [place], [address] [phone]  [facility]                                    [address], [address], [place], [place], [address] [phone]  [facility] [place] [address], [place], [place], [address] [phone]  [facility]                                 [address], [place], , [address] [phone]  [facility]                                      [address], [place], [place], [address] [phone]  [facility] [address], [address], [place], [place], [address]                              [facility] [address], [place], [place], [address] [phone]  [facility]         Phone: [phone]  [facility]      Phone: [phone]   [facility]      Phone: [phone]  [facility]         Phone: [phone]   [facility]    Phone: [phone]  [facility]      Phone: [phone]  [facility]      Phone: [phone]  [facility]        Phone: [phone]  [facility]      Phone: [phone]  

## 2022-12-22 NOTE — MAU Note (Signed)
.  Stacy Mayo is a 27 y.o. at [redacted]w[redacted]d here in MAU reporting: started having spotting yesterday and it has been intermittent since then. States it has changed from pink to brown. Is being followed by [facility], first [place] is scheduled on 01/01/23. She states she has had x2 quants in the office (last one was 123 on 12/18/22 ). Also reports lower abdominal cramping.   Pain score: 4 Vitals:   12/22/22 1241  BP: 125/73  Pulse: 94  Resp: 14  Temp: 98.4 F (36.9 C)  SpO2: 100%      Lab orders placed from triage:  UA

## 2022-12-23 LAB — GC/CHLAMYDIA PROBE AMP (~~LOC~~) NOT AT ARMC
Chlamydia: NEGATIVE
Comment: NEGATIVE
Comment: NORMAL
Neisseria Gonorrhea: NEGATIVE

## 2022-12-24 ENCOUNTER — Telehealth: Payer: Self-pay | Admitting: Family Medicine

## 2022-12-24 ENCOUNTER — Telehealth: Payer: Self-pay | Admitting: *Deleted

## 2022-12-24 ENCOUNTER — Ambulatory Visit: Payer: No Typology Code available for payment source

## 2022-12-24 NOTE — Telephone Encounter (Signed)
Called patient today and left voicemail letting her know to call to get a quant lab scheduled for her.

## 2022-12-24 NOTE — Telephone Encounter (Signed)
Patient is a [facility] Patient, I sent Modena Slater a "IM" message to get the patient scheduled there.

## 2022-12-30 ENCOUNTER — Inpatient Hospital Stay (HOSPITAL_COMMUNITY)
Admission: AD | Admit: 2022-12-30 | Discharge: 2022-12-30 | Disposition: A | Discharge status: Home / Self Care | Payer: No Typology Code available for payment source | Attending: Obstetrics & Gynecology | Admitting: Obstetrics & Gynecology

## 2022-12-30 DIAGNOSIS — O039 Complete or unspecified spontaneous abortion without complication: Secondary | ICD-10-CM | POA: Insufficient documentation

## 2022-12-30 DIAGNOSIS — Z3A01 Less than 8 weeks gestation of pregnancy: Secondary | ICD-10-CM | POA: Diagnosis not present

## 2022-12-30 LAB — COMPREHENSIVE METABOLIC PANEL
ALT: 18 U/L (ref 0–44)
AST: 20 U/L (ref 15–41)
Albumin: 3.4 g/dL — ABNORMAL LOW (ref 3.5–5.0)
Alkaline Phosphatase: 60 U/L (ref 38–126)
Anion gap: 5 (ref 5–15)
BUN: 7 mg/dL (ref 6–20)
CO2: 23 mmol/L (ref 22–32)
Calcium: 8.7 mg/dL — ABNORMAL LOW (ref 8.9–10.3)
Chloride: 107 mmol/L (ref 98–111)
Creatinine, Ser: 0.75 mg/dL (ref 0.44–1.00)
GFR, Estimated: >60 mL/min (ref >60)
Glucose, Bld: 93 mg/dL (ref 70–99)
Potassium: 3.5 mmol/L (ref 3.5–5.1)
Sodium: 135 mmol/L (ref 135–145)
Total Bilirubin: 0.7 mg/dL (ref 0.3–1.2)
Total Protein: 6.8 g/dL (ref 6.5–8.1)

## 2022-12-30 LAB — CBC
HCT: 33.7 % — ABNORMAL LOW (ref 36.0–46.0)
Hemoglobin: 11.3 g/dL — ABNORMAL LOW (ref 12.0–15.0)
MCH: 28.5 pg (ref 26.0–34.0)
MCHC: 33.5 g/dL (ref 30.0–36.0)
MCV: 84.9 fL (ref 80.0–100.0)
Platelets: 352 10*3/uL (ref 150–400)
RBC: 3.97 MIL/uL (ref 3.87–5.11)
RDW: 14.1 % (ref 11.5–15.5)
WBC: 6 10*3/uL (ref 4.0–10.5)
nRBC: 0 % (ref 0.0–0.2)

## 2022-12-30 LAB — HCG, QUANTITATIVE, PREGNANCY: hCG, Beta Chain, Quant, S: 104 m[IU]/mL — ABNORMAL HIGH (ref <5)

## 2022-12-30 NOTE — MAU Provider Note (Signed)
History     [id number]  Arrival date and time: 12/30/22 0731    Chief Complaint  Patient presents with   Vaginal Bleeding   Abdominal Pain     HPI Stacy Mayo is a 27 y.o. at 110w3d by LMP, who presents for vaginal bleeding.   Patient was seen in MAU on 12/22/2022 for vaginal bleeding in setting of early pregnancy At that time [place] did not show any acute findings, no IUP Hcg was 217  She reports that since then she had daily light spotting but that this morning she developed heavy bleeding Her usual period is normally heavy with clots This is slightly different though has difficulty describing how, but she is passing some clots Also having crampy pain like a period No vaginal discharge or burning or pain with urination Reports [facility]   --/--/O POS (03/26 1257)  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/2019   +HPV , negative 16/18, repeat HPV in year per ASCCP per 2019 guidelines    Chlamydia 09/03/2016   Contraceptive management 10/11/2014   Menorrhagia with regular cycle 10/11/2014   Vaginal discharge 10/11/2014   Weight gain 10/11/2014    Past Surgical History:  Procedure Laterality Date   NO PAST SURGERIES      Family History  Problem Relation Age of Onset   Heart failure Paternal Grandfather    Heart failure Paternal Grandmother    Lung disease Paternal Grandmother    Hypertension Maternal Grandmother    Diabetes Maternal Grandfather    Heart failure Maternal Grandfather    Hypertension Father    Hypertension Mother     Social History   Socioeconomic History   Marital status: Single    Spouse name: Not on file   Number of children: 0    Years of education: Not on file   Highest education level: Not on file  Occupational History   Not on file  Tobacco Use   Smoking status: Never   Smokeless tobacco: Never  Vaping Use   Vaping Use: Never used  Substance and Sexual Activity   Alcohol use: No   Drug use: No   Sexual activity: Yes    Birth control/protection: None  Other Topics Concern   Not on file  Social History Narrative   Not on file   Social Determinants of Health   Financial Resource Strain: Low Risk  (02/05/2022)   Overall Financial Resource Strain (CARDIA)    Difficulty of Paying Living Expenses: Not hard at all  Food Insecurity: No Food Insecurity (02/05/2022)   Hunger Vital Sign    Worried About Running Out of Food in the Last Year: Never true    Ran Out of Food in the Last Year: Never true  Transportation Needs: No Transportation Needs (02/05/2022)   PRAPARE - Hydrologist (Medical): No    Lack of Transportation (Non-Medical): No  Physical Activity: Inactive (02/05/2022)   Exercise  Vital Sign    Days of Exercise per Week: 0 days    Minutes of Exercise per Session: 0 min  Stress: No Stress Concern Present (02/05/2022)   [place]    Feeling of Stress : Not at all  Social Connections: Moderately Integrated (02/05/2022)   Social Connection and Isolation Panel [NHANES]    Frequency of Communication with Friends and Family: Twice a week    Frequency of Social Gatherings with Friends and Family: Once a week    Attends Religious Services: 1 to 4 times per year    Active Member of [employer] or Organizations: No    Attends Archivist Meetings: Never    Marital Status: Living with partner  Intimate Partner Violence: Not At Risk (02/05/2022)   Humiliation, Afraid, Rape, and Kick questionnaire    Fear of Current or Ex-Partner: No    Emotionally Abused: No    Physically Abused: No    Sexually Abused: No     No Known Allergies  No current facility-administered medications on file prior to encounter.   Current Outpatient Medications on File Prior to Encounter  Medication Sig Dispense Refill   prenatal vitamin w/FE, FA (PRENATAL 1 + 1) 27-1 MG TABS tablet Take 1 tablet by mouth daily at 12 noon. 30 tablet 12   ferrous sulfate 325 (65 FE) MG tablet Take 325 mg by mouth daily with breakfast. (Patient not taking: Reported on 02/05/2022)       ROS Pertinent positives and negative per HPI, all others reviewed and negative  Physical Exam   BP 121/74 (BP Location: Right Arm)   Pulse 88   Temp 98.4 F (36.9 C) (Oral)   Resp 17   Ht 5\' 4"  (1.626 m)   Wt 107 kg   LMP 11/15/2022   SpO2 100%   BMI 40.49 kg/m   Patient Vitals for the past 24 hrs:  BP Temp Temp src Pulse Resp SpO2 Height Weight  12/30/22 0742 121/74 98.4 F (36.9 C) Oral 88 17 100 % 5\' 4"  (1.626 m) 107 kg    Physical Exam Vitals reviewed.  Constitutional:      General: She is not in acute distress.    Appearance: She is well-developed. She is not diaphoretic.  Eyes:     General: No scleral icterus. Pulmonary:     Effort: Pulmonary effort is normal. No respiratory distress.  Skin:    General: Skin is warm and dry.  Neurological:     Mental Status: She is alert.     Coordination: Coordination normal.      Cervical Exam    Bedside Ultrasound Not done  My interpretation: n/a  FHT N/a  Labs Results for orders placed or performed during the hospital encounter of 12/30/22 (from the past 24 hour(s))  CBC     Status: Abnormal   Collection Time: 12/30/22  8:32 AM  Result Value Ref Range   WBC 6.0 4.0 - 10.5 K/uL   RBC 3.97 3.87 - 5.11 MIL/uL   Hemoglobin 11.3 (L) 12.0 - 15.0 g/dL   HCT 33.7 (L) 36.0 - 46.0 %   MCV 84.9 80.0 - 100.0 fL   MCH 28.5 26.0 - 34.0 pg   MCHC 33.5 30.0 - 36.0 g/dL   RDW 14.1 11.5 - 15.5 %   Platelets 352 150 - 400 K/uL   nRBC 0.0 0.0 - 0.2 %  hCG, quantitative, pregnancy      Status: Abnormal  Collection Time: 12/30/22  8:32 AM  Result Value Ref Range   hCG, Beta Chain, Quant, S 104 (H) <5 mIU/mL  Comprehensive metabolic panel     Status: Abnormal   Collection Time: 12/30/22  8:32 AM  Result Value Ref Range   Sodium 135 135 - 145 mmol/L   Potassium 3.5 3.5 - 5.1 mmol/L   Chloride 107 98 - 111 mmol/L   CO2 23 22 - 32 mmol/L   Glucose, Bld 93 70 - 99 mg/dL   BUN 7 6 - 20 mg/dL   Creatinine, Ser 0.75 0.44 - 1.00 mg/dL   Calcium 8.7 (L) 8.9 - 10.3 mg/dL   Total Protein 6.8 6.5 - 8.1 g/dL   Albumin 3.4 (L) 3.5 - 5.0 g/dL   AST 20 15 - 41 U/L   ALT 18 0 - 44 U/L   Alkaline Phosphatase 60 38 - 126 U/L   Total Bilirubin 0.7 0.3 - 1.2 mg/dL   GFR, Estimated >60 >60 mL/min   Anion gap 5 5 - 15    Imaging No results found.  MAU Course  Procedures Lab Orders         CBC         hCG, quantitative, pregnancy         Comprehensive metabolic panel    No orders of the defined types were placed in this encounter.  Imaging Orders  No imaging studies ordered today    MDM moderate  Assessment and Plan  #Miscarriage #[redacted] weeks gestation of pregnancy HCG downtrending c/w miscarriage. Reassured patient that miscarriage is common with ~1/4 of women experiencing it in their lifetime. Reassured patient that there is nothing she did or did not do to cause this. Reviewed most common reason is presumed to be genetic abnormalities that allow a pregnancy to start but not continue past an early stage, but realistically we do not know the cause in most cases. Reviewed that studies show no definite difference between attempting another pregnancy sooner vs waiting, though some studies do show better live birth outcomes with trying sooner. Blood type --/--/O POS (03/26 1257), rhogam was not indicated. Discussed importance of trending hcg to 0 as we never confirmed IUP, she prefers to do this with [facility], discussed she can call our office if this is not a viable  option, given number in discharge paperwork.     Dispo: discharged to home in stable condition.    Clarnce Flock, MD/MPH 12/30/22 10:00 AM  Allergies as of 12/30/2022   No Known Allergies      Medication List     TAKE these medications    ferrous sulfate 325 (65 FE) MG tablet Take 325 mg by mouth daily with breakfast.   prenatal vitamin w/FE, FA 27-1 MG Tabs tablet Take 1 tablet by mouth daily at 12 noon.

## 2022-12-30 NOTE — MAU Note (Signed)
Stacy Mayo is a 27 y.o. at [redacted]w[redacted]d here in MAU reporting: started bleeding heavier with clots this morning. Period cramps.  Repeated HCG level at her dr's office on 3/27 260.  Onset of complaint: ongoing- increased this morning. Pain score: 5 Vitals:   12/30/22 0742  BP: 121/74  Pulse: 88  Resp: 17  Temp: 98.4 F (36.9 C)  SpO2: 100%      Lab orders placed from triage:

## 2023-05-31 ENCOUNTER — Other Ambulatory Visit: Payer: Self-pay

## 2023-05-31 ENCOUNTER — Emergency Department (HOSPITAL_COMMUNITY)
Admission: EM | Admit: 2023-05-31 | Discharge: 2023-05-31 | Disposition: A | Discharge status: Home / Self Care | Payer: No Typology Code available for payment source | Attending: Emergency Medicine | Admitting: Emergency Medicine

## 2023-05-31 ENCOUNTER — Encounter (HOSPITAL_COMMUNITY): Payer: Self-pay

## 2023-05-31 DIAGNOSIS — Z3A01 Less than 8 weeks gestation of pregnancy: Secondary | ICD-10-CM | POA: Diagnosis not present

## 2023-05-31 DIAGNOSIS — O200 Threatened abortion: Secondary | ICD-10-CM | POA: Insufficient documentation

## 2023-05-31 DIAGNOSIS — O209 Hemorrhage in early pregnancy, unspecified: Secondary | ICD-10-CM | POA: Diagnosis present

## 2023-05-31 LAB — CBC WITH DIFFERENTIAL/PLATELET
Abs Immature Granulocytes: 0.01 10*3/uL (ref 0.00–0.07)
Basophils Absolute: 0.1 10*3/uL (ref 0.0–0.1)
Basophils Relative: 1 %
Eosinophils Absolute: 0.7 10*3/uL — ABNORMAL HIGH (ref 0.0–0.5)
Eosinophils Relative: 9 %
HCT: 37.8 % (ref 36.0–46.0)
Hemoglobin: 12.4 g/dL (ref 12.0–15.0)
Immature Granulocytes: 0 %
Lymphocytes Relative: 30 %
Lymphs Abs: 2.3 10*3/uL (ref 0.7–4.0)
MCH: 28.2 pg (ref 26.0–34.0)
MCHC: 32.8 g/dL (ref 30.0–36.0)
MCV: 86.1 fL (ref 80.0–100.0)
Monocytes Absolute: 0.5 10*3/uL (ref 0.1–1.0)
Monocytes Relative: 6 %
Neutro Abs: 4.3 10*3/uL (ref 1.7–7.7)
Neutrophils Relative %: 54 %
Platelets: 381 10*3/uL (ref 150–400)
RBC: 4.39 MIL/uL (ref 3.87–5.11)
RDW: 14.5 % (ref 11.5–15.5)
WBC: 7.8 10*3/uL (ref 4.0–10.5)
nRBC: 0 % (ref 0.0–0.2)

## 2023-05-31 LAB — BASIC METABOLIC PANEL
Anion gap: 9 (ref 5–15)
BUN: 8 mg/dL (ref 6–20)
CO2: 24 mmol/L (ref 22–32)
Calcium: 9.4 mg/dL (ref 8.9–10.3)
Chloride: 103 mmol/L (ref 98–111)
Creatinine, Ser: 0.69 mg/dL (ref 0.44–1.00)
GFR, Estimated: >60 mL/min (ref >60)
Glucose, Bld: 99 mg/dL (ref 70–99)
Potassium: 3.6 mmol/L (ref 3.5–5.1)
Sodium: 136 mmol/L (ref 135–145)

## 2023-05-31 LAB — HCG, QUANTITATIVE, PREGNANCY: hCG, Beta Chain, Quant, S: 40773 m[IU]/mL — ABNORMAL HIGH (ref <5)

## 2023-05-31 LAB — POC URINE PREG, ED: Preg Test, Ur: POSITIVE — AB

## 2023-05-31 NOTE — Discharge Instructions (Signed)
Follow back up with the fertility clinic.  Show them your quantitative hCG on MyChart.  Having that repeated 48 hours from now which would be Wednesday will be very telling.  Return for any new or worse symptoms.

## 2023-05-31 NOTE — ED Provider Notes (Addendum)
[facility] EMERGENCY DEPARTMENT AT [facility] Provider Note   CSN: [id number] Arrival date & time: 05/31/23  1649     History  Chief Complaint  Patient presents with   Vaginal Bleeding    Pregnant (6 weeks)    Stacy Mayo is a 27 y.o. female.  Patient followed by fertility clinic in [place].  Patient is seen Friday had ultrasound that showed IUP.  She was told she is 6 weeks 2 days.  Patient's had difficulty with miscarriages in the past.  Patient just started spotting some today.  No heavy passage of clots no severe abdominal pain.  Temp is 98.9 blood pressure 140/90 oxygen saturation room air 100% heart rate 79 respirations 16.  Chart shows that patient is O+ blood type.       Home Medications Prior to Admission medications   Medication Sig Start Date End Date Taking? Authorizing Provider  ferrous sulfate 325 (65 FE) MG tablet Take 325 mg by mouth daily with breakfast. Patient not taking: Reported on 02/05/2022    [provider]  prenatal vitamin w/FE, FA (PRENATAL 1 + 1) 27-1 MG TABS tablet Take 1 tablet by mouth daily at 12 noon. 03/12/21   Adline Potter, NP      Allergies    Patient has no known allergies.    Review of Systems   Review of Systems  Constitutional:  Negative for chills and fever.  HENT:  Negative for ear pain and sore throat.   Eyes:  Negative for pain and visual disturbance.  Respiratory:  Negative for cough and shortness of breath.   Cardiovascular:  Negative for chest pain and palpitations.  Gastrointestinal:  Negative for abdominal pain and vomiting.  Genitourinary:  Positive for vaginal bleeding. Negative for dysuria and hematuria.  Musculoskeletal:  Negative for arthralgias and back pain.  Skin:  Negative for color change and rash.  Neurological:  Negative for seizures and syncope.  All other systems reviewed and are negative.   Physical Exam Updated Vital Signs BP (!) 140/90 (BP Location: Right Arm)   Pulse  79   Temp 98.9 F (37.2 C) (Oral)   Resp 16   Ht 1.651 m (5\' 5" )   Wt 111.1 kg   LMP 11/15/2022   SpO2 100%   BMI 40.77 kg/m  Physical Exam Vitals and nursing note reviewed.  Constitutional:      General: She is not in acute distress.    Appearance: Normal appearance. She is well-developed.  HENT:     Head: Normocephalic and atraumatic.  Eyes:     Extraocular Movements: Extraocular movements intact.     Conjunctiva/sclera: Conjunctivae normal.     Pupils: Pupils are equal, round, and reactive to light.  Cardiovascular:     Rate and Rhythm: Normal rate and regular rhythm.     Heart sounds: No murmur heard. Pulmonary:     Effort: Pulmonary effort is normal. No respiratory distress.     Breath sounds: Normal breath sounds.  Abdominal:     General: There is no distension.     Palpations: Abdomen is soft.     Tenderness: There is no abdominal tenderness.  Musculoskeletal:        General: No swelling.     Cervical back: Normal range of motion and neck supple.  Skin:    General: Skin is warm and dry.     Capillary Refill: Capillary refill takes less than 2 seconds.  Neurological:     General: No  focal deficit present.     Mental Status: She is alert and oriented to person, place, and time.  Psychiatric:        Mood and Affect: Mood normal.     ED Results / Procedures / Treatments   Labs (all labs ordered are listed, but only abnormal results are displayed) Labs Reviewed  POC URINE PREG, ED - Abnormal; Notable for the following components:      Result Value   Preg Test, Ur Positive (*)    All other components within normal limits  CBC WITH DIFFERENTIAL/PLATELET  BASIC METABOLIC PANEL  HCG, QUANTITATIVE, PREGNANCY    EKG None  Radiology No results found.  Procedures Procedures    Medications Ordered in ED Medications - No data to display  ED Course/ Medical Decision Making/ A&P                                 Medical Decision Making Amount and/or  Complexity of Data Reviewed Labs: ordered.   Pregnancy test positive here today.  Will get quantitative hCG.  Will get CBC basic metabolic panel.  Patient is O+ blood type so RhoGAM not a concern.  Patient has the fertility clinic to follow-up with.  Today's quantitative hCG will help be helpful in determining where the pregnancy is headed.  Patient provided with precautions.  Patient nontoxic no acute distress abdomen soft and nontender.  Patient CBC white blood cell count normal at 7.8 hemoglobin 12.4 very reassuring.  Platelets also 381.  Basic metabolic panel without any significant abnormalities renal functions normal.  Quantitative hCG still pending but we do not have to have that back patient can follow that up on MyChart.   Final Clinical Impression(s) / ED Diagnoses Final diagnoses:  Threatened miscarriage    Rx / DC Orders ED Discharge Orders     None         Vanetta Mulders, MD 05/31/23 Leslye Peer, MD 05/31/23 Ernestina Columbia

## 2023-05-31 NOTE — ED Triage Notes (Signed)
Pt reports:  Vaginal Bleeding Started 30 minutes ago Pregnant  [redacted] weeks and 2 days

## 2023-06-25 LAB — HEPATITIS C ANTIBODY: HCV Ab: NEGATIVE

## 2023-06-25 LAB — OB RESULTS CONSOLE HEPATITIS B SURFACE ANTIGEN: Hepatitis B Surface Ag: NEGATIVE

## 2023-06-25 LAB — OB RESULTS CONSOLE RUBELLA ANTIBODY, IGM: Rubella: IMMUNE

## 2023-06-25 LAB — OB RESULTS CONSOLE HIV ANTIBODY (ROUTINE TESTING): HIV: NONREACTIVE

## 2023-07-02 ENCOUNTER — Other Ambulatory Visit (HOSPITAL_COMMUNITY)
Admission: RE | Admit: 2023-07-02 | Discharge: 2023-07-02 | Disposition: A | Discharge status: Home / Self Care | Payer: No Typology Code available for payment source | Source: Ambulatory Visit | Attending: Obstetrics and Gynecology | Admitting: Obstetrics and Gynecology

## 2023-07-02 ENCOUNTER — Other Ambulatory Visit: Payer: Self-pay | Admitting: Obstetrics and Gynecology

## 2023-07-02 DIAGNOSIS — Z3490 Encounter for supervision of normal pregnancy, unspecified, unspecified trimester: Secondary | ICD-10-CM | POA: Diagnosis present

## 2023-07-02 LAB — OB RESULTS CONSOLE GC/CHLAMYDIA
Chlamydia: NEGATIVE
Neisseria Gonorrhea: NEGATIVE

## 2023-07-08 LAB — CYTOLOGY - PAP
Comment: NEGATIVE
Diagnosis: NEGATIVE
High risk HPV: NEGATIVE

## 2023-09-29 NOTE — L&D Delivery Note (Signed)
 Delivery Note Arrived to room to evaluate for cervical change. Patient was found to be complete and +1 station. After multiple pushes, fetal head was delivered. A gush of port wine stained color fluid emerged after delivery of the head. No nuchal was present. Shoulders and body followed without difficulty. Infant was placed on mother's abdomen, mouth and nose were bulb suctioned and let out a vigorous cry. Cord clamped and cut immediately. Venous cord blood was collected. Placenta delivered spontaneously. Cervix, vagina, perineum and labia were inspected and a second degree perineal laceration and superficial bilateral labial lacerations were noted.  The second degree perineal laceration was repaired using 3-0 Vicryl in standard fashion. The bilateral labial lacerations were repaired using 4-0 Vicryl. TXA 1g was given as prophylaxis. Uterus fundus firm. Placenta was inspected, found to be intact with a 3 vessel cord. Counts were correct x 2.   At 10:47 AM a viable and healthy female was delivered via  (Presentation: Direct OA).  APGAR: 6, 9; weight: Pending Placenta status: Spontaneous, Intact.  Cord: 3 vessels with the following complications: None.  Cord pH: Not collected  Anesthesia: Epidural Episiotomy: None Lacerations: 2nd degree;Labial Suture Repair: 3.0 vicryl Est. Blood Loss (mL):  376  Mom to postpartum.  Baby to Couplet care / Skin to Skin.  Stacy Mayo 01/28/2024, 11:14 AM

## 2023-10-26 LAB — OB RESULTS CONSOLE RPR: RPR: NONREACTIVE

## 2023-12-29 LAB — OB RESULTS CONSOLE GBS: GBS: POSITIVE

## 2024-01-18 ENCOUNTER — Inpatient Hospital Stay (HOSPITAL_COMMUNITY)
Admission: AD | Admit: 2024-01-18 | Discharge: 2024-01-18 | Disposition: A | Discharge status: Home / Self Care | Attending: Obstetrics and Gynecology | Admitting: Obstetrics and Gynecology

## 2024-01-18 ENCOUNTER — Encounter (HOSPITAL_COMMUNITY): Payer: Self-pay | Admitting: Obstetrics and Gynecology

## 2024-01-18 DIAGNOSIS — O471 False labor at or after 37 completed weeks of gestation: Secondary | ICD-10-CM | POA: Insufficient documentation

## 2024-01-18 DIAGNOSIS — Z3A39 39 weeks gestation of pregnancy: Secondary | ICD-10-CM | POA: Diagnosis not present

## 2024-01-18 DIAGNOSIS — O163 Unspecified maternal hypertension, third trimester: Secondary | ICD-10-CM | POA: Diagnosis not present

## 2024-01-18 DIAGNOSIS — O479 False labor, unspecified: Secondary | ICD-10-CM

## 2024-01-18 NOTE — MAU Provider Note (Signed)
 None      S: Ms. Stacy Mayo is a 28 y.o. G2P0000 at [redacted]w[redacted]d  who presents to MAU today complaining contractions q 5 minutes since 0800. She denies vaginal bleeding. She denies LOF. She reports normal fetal movement.    Denies hx hypertensive disorders. Per chart review, pt with mild range BP of 140/90 on 05/31/23 (early in this pregnancy).    O: BP 125/69   Pulse 94   Temp 98.2 F (36.8 C)   Resp 16   SpO2 100%  RN labor check  Cervical exam:  Dilation: Closed Effacement (%): Thick Station: Ballotable Presentation: Vertex Exam by:: Mickeal Aland, RN   Fetal Monitoring: Baseline: 140 Variability:  mod Accelerations: present Decelerations: absent Contractions: every 5 min   A/P: SIUP at [redacted]w[redacted]d here for labor eval, cervix is closed/thick/high Based on elevated BP reading x 1 on initial eval (repeat wnl) and elevated BP in mild range in September, pt does rule in for gestational hypertension. Patient discussed w Dr. Adrain Hopes, who recommends deferring further PEC w/up for now, will have close f/up for BP check in clinic tomorrow Discharged w return precautions   Melanie Spires, MD 01/18/2024 8:47 PM

## 2024-01-18 NOTE — MAU Note (Signed)
..  Stacy Mayo is a 28 y.o. at Unknown here in MAU reporting: contractions which started around 0800 this morning. Have gotten progressively stronger and more frequent throughout the day. Currently feeling them about every 5 min and rates pain at a 6-7/10.   No leaking fluid. No bleeding or discharge. Endorses +FM  Vitals:   01/18/24 1710  BP: (!) 137/90  Pulse: (!) 117  Resp: 18  Temp: 98.3 F (36.8 C)     FHT:130 Lab orders placed from triage:  UA

## 2024-01-24 ENCOUNTER — Encounter (HOSPITAL_COMMUNITY): Payer: Self-pay | Admitting: *Deleted

## 2024-01-24 ENCOUNTER — Telehealth (HOSPITAL_COMMUNITY): Payer: Self-pay | Admitting: *Deleted

## 2024-01-24 NOTE — Telephone Encounter (Signed)
 Preadmission screen

## 2024-01-27 ENCOUNTER — Other Ambulatory Visit: Payer: Self-pay | Admitting: Obstetrics and Gynecology

## 2024-01-27 ENCOUNTER — Encounter (HOSPITAL_COMMUNITY): Payer: Self-pay | Admitting: Obstetrics and Gynecology

## 2024-01-27 ENCOUNTER — Inpatient Hospital Stay (HOSPITAL_COMMUNITY)
Admission: RE | Admit: 2024-01-27 | Discharge: 2024-01-29 | DRG: 807 | Disposition: A | Discharge status: Home / Self Care | Payer: No Typology Code available for payment source | Attending: Obstetrics and Gynecology | Admitting: Obstetrics and Gynecology

## 2024-01-27 DIAGNOSIS — Z3A40 40 weeks gestation of pregnancy: Secondary | ICD-10-CM | POA: Diagnosis not present

## 2024-01-27 DIAGNOSIS — O99824 Streptococcus B carrier state complicating childbirth: Secondary | ICD-10-CM | POA: Diagnosis present

## 2024-01-27 DIAGNOSIS — Z8249 Family history of ischemic heart disease and other diseases of the circulatory system: Secondary | ICD-10-CM | POA: Diagnosis not present

## 2024-01-27 DIAGNOSIS — O1413 Severe pre-eclampsia, third trimester: Principal | ICD-10-CM

## 2024-01-27 DIAGNOSIS — O9902 Anemia complicating childbirth: Secondary | ICD-10-CM | POA: Diagnosis present

## 2024-01-27 DIAGNOSIS — Z833 Family history of diabetes mellitus: Secondary | ICD-10-CM

## 2024-01-27 DIAGNOSIS — O134 Gestational [pregnancy-induced] hypertension without significant proteinuria, complicating childbirth: Secondary | ICD-10-CM | POA: Diagnosis present

## 2024-01-27 DIAGNOSIS — D509 Iron deficiency anemia, unspecified: Secondary | ICD-10-CM | POA: Diagnosis present

## 2024-01-27 DIAGNOSIS — O1414 Severe pre-eclampsia complicating childbirth: Secondary | ICD-10-CM | POA: Diagnosis present

## 2024-01-27 DIAGNOSIS — O139 Gestational [pregnancy-induced] hypertension without significant proteinuria, unspecified trimester: Principal | ICD-10-CM | POA: Diagnosis present

## 2024-01-27 LAB — CBC
HCT: 29.5 % — ABNORMAL LOW (ref 36.0–46.0)
Hemoglobin: 9.5 g/dL — ABNORMAL LOW (ref 12.0–15.0)
MCH: 27.1 pg (ref 26.0–34.0)
MCHC: 32.2 g/dL (ref 30.0–36.0)
MCV: 84 fL (ref 80.0–100.0)
Platelets: 264 10*3/uL (ref 150–400)
RBC: 3.51 MIL/uL — ABNORMAL LOW (ref 3.87–5.11)
RDW: 14.6 % (ref 11.5–15.5)
WBC: 5.1 10*3/uL (ref 4.0–10.5)
nRBC: 0 % (ref 0.0–0.2)

## 2024-01-27 LAB — COMPREHENSIVE METABOLIC PANEL WITH GFR
ALT: 12 U/L (ref 0–44)
AST: 20 U/L (ref 15–41)
Albumin: 2.8 g/dL — ABNORMAL LOW (ref 3.5–5.0)
Alkaline Phosphatase: 131 U/L — ABNORMAL HIGH (ref 38–126)
Anion gap: 10 (ref 5–15)
BUN: 5 mg/dL — ABNORMAL LOW (ref 6–20)
CO2: 19 mmol/L — ABNORMAL LOW (ref 22–32)
Calcium: 8.8 mg/dL — ABNORMAL LOW (ref 8.9–10.3)
Chloride: 109 mmol/L (ref 98–111)
Creatinine, Ser: 0.38 mg/dL — ABNORMAL LOW (ref 0.44–1.00)
GFR, Estimated: >60 mL/min (ref >60)
Glucose, Bld: 82 mg/dL (ref 70–99)
Potassium: 3.3 mmol/L — ABNORMAL LOW (ref 3.5–5.1)
Sodium: 138 mmol/L (ref 135–145)
Total Bilirubin: 0.6 mg/dL (ref 0.0–1.2)
Total Protein: 6.4 g/dL — ABNORMAL LOW (ref 6.5–8.1)

## 2024-01-27 LAB — PROTEIN / CREATININE RATIO, URINE
Creatinine, Urine: 74 mg/dL
Protein Creatinine Ratio: 0.14 mg/mg{creat} (ref 0.00–0.15)
Total Protein, Urine: 10 mg/dL

## 2024-01-27 LAB — TYPE AND SCREEN
ABO/RH(D): O POS
Antibody Screen: NEGATIVE

## 2024-01-27 MED ORDER — MISOPROSTOL 50MCG HALF TABLET
50.0000 ug | ORAL_TABLET | Freq: Once | ORAL | Status: AC
  Administered 2024-01-27: 50 ug via ORAL
  Filled 2024-01-27: qty 1

## 2024-01-27 MED ORDER — LIDOCAINE HCL (PF) 1 % IJ SOLN: 30.0000 mL | INTRAMUSCULAR | Status: DC | PRN

## 2024-01-27 MED ORDER — LABETALOL HCL 5 MG/ML IV SOLN: 40.0000 mg | INTRAVENOUS | Status: DC | PRN

## 2024-01-27 MED ORDER — LACTATED RINGERS IV SOLN
500.0000 mL | INTRAVENOUS | Status: DC | PRN
  Administered 2024-01-27 (×2): 500 mL via INTRAVENOUS
  Administered 2024-01-28: 1000 mL via INTRAVENOUS

## 2024-01-27 MED ORDER — SOD CITRATE-CITRIC ACID 500-334 MG/5ML PO SOLN: 30.0000 mL | ORAL | Status: DC | PRN

## 2024-01-27 MED ORDER — HYDROXYZINE HCL 50 MG PO TABS: 50.0000 mg | ORAL_TABLET | Freq: Four times a day (QID) | ORAL | Status: DC | PRN

## 2024-01-27 MED ORDER — ONDANSETRON HCL 4 MG/2ML IJ SOLN: 4.0000 mg | Freq: Four times a day (QID) | INTRAMUSCULAR | Status: DC | PRN

## 2024-01-27 MED ORDER — HYDRALAZINE HCL 20 MG/ML IJ SOLN: 10.0000 mg | INTRAMUSCULAR | Status: DC | PRN

## 2024-01-27 MED ORDER — LACTATED RINGERS IV SOLN: INTRAVENOUS | Status: DC

## 2024-01-27 MED ORDER — LABETALOL HCL 5 MG/ML IV SOLN: 20.0000 mg | INTRAVENOUS | Status: DC | PRN

## 2024-01-27 MED ORDER — MAGNESIUM SULFATE 40 GM/1000ML IV SOLN
2.0000 g/h | INTRAVENOUS | Status: DC
  Administered 2024-01-27: 2 g/h via INTRAVENOUS
  Filled 2024-01-27: qty 1000

## 2024-01-27 MED ORDER — MISOPROSTOL 25 MCG QUARTER TABLET
25.0000 ug | ORAL_TABLET | Freq: Once | ORAL | Status: AC
  Administered 2024-01-27: 25 ug via VAGINAL
  Filled 2024-01-27: qty 1

## 2024-01-27 MED ORDER — PENICILLIN G POT IN DEXTROSE 60000 UNIT/ML IV SOLN
3.0000 10*6.[IU] | INTRAVENOUS | Status: DC
  Administered 2024-01-27 – 2024-01-28 (×3): 3 10*6.[IU] via INTRAVENOUS
  Filled 2024-01-27 (×3): qty 50

## 2024-01-27 MED ORDER — FENTANYL CITRATE (PF) 100 MCG/2ML IJ SOLN: 50.0000 ug | INTRAMUSCULAR | Status: DC | PRN

## 2024-01-27 MED ORDER — OXYCODONE-ACETAMINOPHEN 5-325 MG PO TABS: 2.0000 | ORAL_TABLET | ORAL | Status: DC | PRN

## 2024-01-27 MED ORDER — OXYTOCIN BOLUS FROM INFUSION
333.0000 mL | Freq: Once | INTRAVENOUS | Status: AC
  Administered 2024-01-28: 333 mL via INTRAVENOUS

## 2024-01-27 MED ORDER — OXYTOCIN-SODIUM CHLORIDE 30-0.9 UT/500ML-% IV SOLN
2.5000 [IU]/h | INTRAVENOUS | Status: DC
  Filled 2024-01-27: qty 500

## 2024-01-27 MED ORDER — OXYCODONE-ACETAMINOPHEN 5-325 MG PO TABS: 1.0000 | ORAL_TABLET | ORAL | Status: DC | PRN

## 2024-01-27 MED ORDER — SODIUM CHLORIDE 0.9 % IV SOLN
5.0000 10*6.[IU] | Freq: Once | INTRAVENOUS | Status: AC
  Administered 2024-01-27: 5 10*6.[IU] via INTRAVENOUS
  Filled 2024-01-27: qty 5

## 2024-01-27 MED ORDER — MAGNESIUM SULFATE BOLUS VIA INFUSION
4.0000 g | Freq: Once | INTRAVENOUS | Status: AC
  Administered 2024-01-27: 4 g via INTRAVENOUS
  Filled 2024-01-27: qty 1000

## 2024-01-27 MED ORDER — LABETALOL HCL 5 MG/ML IV SOLN: 80.0000 mg | INTRAVENOUS | Status: DC | PRN

## 2024-01-27 MED ORDER — TERBUTALINE SULFATE 1 MG/ML IJ SOLN
0.2500 mg | Freq: Once | INTRAMUSCULAR | Status: AC | PRN
  Administered 2024-01-28: 0.25 mg via SUBCUTANEOUS
  Filled 2024-01-27: qty 1

## 2024-01-27 MED ORDER — ACETAMINOPHEN 325 MG PO TABS
650.0000 mg | ORAL_TABLET | ORAL | Status: DC | PRN
  Administered 2024-01-27: 650 mg via ORAL
  Filled 2024-01-27: qty 2

## 2024-01-27 NOTE — Progress Notes (Signed)
 In to assess patient. She reports headache has resolved after dosage of tylenol  here at the hospital. She denies visual disturbances or ruq pain.  She received cytotec  50 mcg po and 25 mcg vaginal at 1730 Vitals:   01/27/24 1630 01/27/24 1631 01/27/24 1646 01/27/24 1732  BP:  128/76 123/79 120/72  Pulse:  (!) 102 93 96  Temp: 97.8 F (36.6 C)     TempSrc: Oral     Weight:      Height:       General alert and oriented NAD ABdomen gravid nontender  Extremities Trace edema bilaterally.  FHR baseline 130's with moderate variability. Prolonged decel to the 60;s for 2 minutes resolved with repositioning.  Toco- Irregular contractions.   Results for orders placed or performed during the hospital encounter of 01/27/24 (from the past 24 hours)  CBC     Status: Abnormal   Collection Time: 01/27/24  3:53 PM  Result Value Ref Range   WBC 5.1 4.0 - 10.5 K/uL   RBC 3.51 (L) 3.87 - 5.11 MIL/uL   Hemoglobin 9.5 (L) 12.0 - 15.0 g/dL   HCT 95.6 (L) 21.3 - 08.6 %   MCV 84.0 80.0 - 100.0 fL   MCH 27.1 26.0 - 34.0 pg   MCHC 32.2 30.0 - 36.0 g/dL   RDW 57.8 46.9 - 62.9 %   Platelets 264 150 - 400 K/uL   nRBC 0.0 0.0 - 0.2 %  Type and screen     Status: None   Collection Time: 01/27/24  3:53 PM  Result Value Ref Range   ABO/RH(D) O POS    Antibody Screen NEG    Sample Expiration      01/30/2024,2359 Performed at [facility], [address], [place], [place] [address]   Protein / creatinine ratio, urine     Status: None   Collection Time: 01/27/24  3:53 PM  Result Value Ref Range   Creatinine, Urine 74 mg/dL   Total Protein, Urine 10 mg/dL   Protein Creatinine Ratio 0.14 0.00 - 0.15 mg/mg[Cre]    A/P 40 weeks and 4 days  Dx gestational hypertension since headache has resolved.  Will discontinue magnesium  restart if [facility] labs are abnormal. Liver enzymes twise normal or creatinine greater than 1.1 [facility] covering after 7 pm . Dr. Adrain Hopes will assume care at 7am tomorrow morning.

## 2024-01-27 NOTE — Progress Notes (Signed)
 AROM by me now with FSE Mod meconium IUPC placed Will give another fluid bolus and do position change Exam 1-2/70/-3 Cat 2 will observe closely

## 2024-01-27 NOTE — H&P (Signed)
 Stacy Mayo is a 28 y.o. female G2P0010 at 40 weeks and 4 days presenting for induction of labor due to preeclampsia with severe features. Pt called the office today complaining of headache that would not resolve with tylenol . She took her blood pressure at home and it was 145/83. She was evaluated in the office and her bp was 139/94 with a persistent headache. She had an elevated blood pressure 1-2 weeks ago in MAU 140's/90's. She denies visual disturbances or ruq pain. Pregnancy has been uncomplicated.  Prenatal care provided by Dr. Arlee Lace with [facility].  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 Social History:  reports that she has never smoked. She has never used smokeless tobacco. She reports that she does not drink alcohol and does not use drugs.     Maternal Diabetes: No Genetic Screening: Normal Maternal Ultrasounds/Referrals: Normal Fetal Ultrasounds or other Referrals:  None Maternal Substance Abuse:  No Significant Maternal Medications:  None Significant Maternal Lab Results:  Group B Strep positive Number  of Prenatal Visits:greater than 3 verified prenatal visits Maternal Vaccinations:TDap Other Comments:  None  Review of Systems  Constitutional:  Positive for fatigue.  HENT: Negative.    Eyes: Negative.   Respiratory: Negative.    Cardiovascular: Negative.   Gastrointestinal: Negative.   Endocrine: Negative.   Genitourinary: Negative.   Musculoskeletal: Negative.   Skin: Negative.   Allergic/Immunologic: Negative.   Neurological:  Positive for headaches.  Hematological: Negative.   Psychiatric/Behavioral: Negative.     History Dilation: Closed Effacement (%): Thick Station: Ballotable Exam by:: Mary [place] Johnson, RNC- OB Blood pressure 120/72, pulse 96, temperature 97.8 F (36.6 C), temperature source Oral, height 5\' 5"  (1.651 m), weight 110.5 kg, unknown if currently breastfeeding. Maternal Exam:  Introitus: Normal vulva.   Physical Exam Vitals reviewed.  Constitutional:      Appearance: Normal appearance.  HENT:     Head: Normocephalic and atraumatic.  Cardiovascular:     Rate and Rhythm: Normal rate and regular rhythm.     Pulses: Normal pulses.     Heart sounds: Normal heart sounds.  Pulmonary:     Effort: Pulmonary effort is normal.     Breath sounds: Normal breath sounds.  Abdominal:     Tenderness: There is no abdominal tenderness.  Genitourinary:    General: Normal vulva.  Musculoskeletal:        General: Swelling present. Normal range of  motion.     Cervical back: Normal range of motion and neck supple.  Skin:    General: Skin is warm and dry.  Neurological:     General: No focal deficit present.     Mental Status: She is alert and oriented to person, place, and time.  Psychiatric:        Mood and Affect: Mood normal.        Behavior: Behavior normal.     Prenatal labs: ABO, Rh: --/--/O POS (05/01 1553) Antibody: NEG (05/01 1553) Rubella: Immune (09/27 0000) RPR: Nonreactive (01/28 0000)  HBsAg: Negative (09/27 0000)  HIV: Non-reactive  (09/27 0000)  GBS:   Positive  Assessment/Plan: 40 weeks and 4 day with preeclampsia with severe features based on headache - Admit to labor and delivery  - check PIH labs  - Magnesium  for seizure prophylaxis  - Cytotec  for cervical ripening.  - Penicillin  for GBS prophylaxis in active labor or with ROM which ever occurs first  - Continuous Fetal monitoring - Pain control - per patient preference.     Arlee Lace 01/27/2024, 5:50 PM

## 2024-01-27 NOTE — Progress Notes (Signed)
 Marvia Goettel is a 28 y.o. G2P0010 at [redacted]w[redacted]d  Subjective: Feels cramping, denies HA, visual changes or abdominal pain  Objective: BP 138/76 (BP Location: Left Arm)   Pulse 90   Temp 97.8 F (36.6 C) (Oral)   Resp 18   Ht 5\' 5"  (1.651 m)   Wt 110.5 kg   BMI 40.52 kg/m  I/O last 3 completed shifts: In: 648.9 [P.O.:118; I.V.:280.9; IV Piggyback:250] Out: 200 [Urine:200] No intake/output data recorded.  FHT:  145, minimal variability, no accels, ?late decels occas (remote read) UC:   not picking up well (reported 1 1/2 to ) SVE:   Dilation: 1 Effacement (%): 70-80 Station: -3 Exam by:: Mary [place] Johnson, RNC- OB  Labs: Lab Results  Component Value Date   WBC 5.1 01/27/2024   HGB 9.5 (L) 01/27/2024   HCT 29.5 (L) 01/27/2024   MCV 84.0 01/27/2024   PLT 264 01/27/2024    Assessment / Plan: Induction of labor due to gestational hypertension, nl labs and PCR 0.14  Labor:  s/p dual cytotec   Preeclampsia:  no signs or symptoms of toxicity Fetal Wellbeing:  Category II Called and spoke with [facility] Pain Control:   upon request I/D:   GBS + pcn held by Dr. Wynona Hedger, will restart now in anticipation of AROM Anticipated MOD:   unsure  Madelene Schanz, MD 01/27/2024, 9:36 PM

## 2024-01-28 ENCOUNTER — Encounter (HOSPITAL_COMMUNITY): Payer: Self-pay | Admitting: Obstetrics and Gynecology

## 2024-01-28 ENCOUNTER — Inpatient Hospital Stay (HOSPITAL_COMMUNITY): Admitting: Anesthesiology

## 2024-01-28 LAB — CBC
HCT: 31.1 % — ABNORMAL LOW (ref 36.0–46.0)
Hemoglobin: 9.9 g/dL — ABNORMAL LOW (ref 12.0–15.0)
MCH: 26.9 pg (ref 26.0–34.0)
MCHC: 31.8 g/dL (ref 30.0–36.0)
MCV: 84.5 fL (ref 80.0–100.0)
Platelets: 240 10*3/uL (ref 150–400)
RBC: 3.68 MIL/uL — ABNORMAL LOW (ref 3.87–5.11)
RDW: 14.6 % (ref 11.5–15.5)
WBC: 9.3 10*3/uL (ref 4.0–10.5)
nRBC: 0 % (ref 0.0–0.2)

## 2024-01-28 LAB — COMPREHENSIVE METABOLIC PANEL WITH GFR
ALT: 12 U/L (ref 0–44)
AST: 23 U/L (ref 15–41)
Albumin: 2.7 g/dL — ABNORMAL LOW (ref 3.5–5.0)
Alkaline Phosphatase: 132 U/L — ABNORMAL HIGH (ref 38–126)
Anion gap: 9 (ref 5–15)
BUN: 5 mg/dL — ABNORMAL LOW (ref 6–20)
CO2: 19 mmol/L — ABNORMAL LOW (ref 22–32)
Calcium: 8.7 mg/dL — ABNORMAL LOW (ref 8.9–10.3)
Chloride: 107 mmol/L (ref 98–111)
Creatinine, Ser: 0.49 mg/dL (ref 0.44–1.00)
GFR, Estimated: >60 mL/min
Glucose, Bld: 92 mg/dL (ref 70–99)
Potassium: 3.2 mmol/L — ABNORMAL LOW (ref 3.5–5.1)
Sodium: 135 mmol/L (ref 135–145)
Total Bilirubin: 0.6 mg/dL (ref 0.0–1.2)
Total Protein: 6.3 g/dL — ABNORMAL LOW (ref 6.5–8.1)

## 2024-01-28 LAB — RPR: RPR Ser Ql: NONREACTIVE

## 2024-01-28 MED ORDER — PRENATAL MULTIVITAMIN CH
1.0000 | ORAL_TABLET | Freq: Every day | ORAL | Status: DC
Start: 2024-01-28 — End: 2024-01-29
  Administered 2024-01-29: 1 via ORAL
  Filled 2024-01-28: qty 1

## 2024-01-28 MED ORDER — DIPHENHYDRAMINE HCL 50 MG/ML IJ SOLN: 12.5000 mg | INTRAMUSCULAR | Status: DC | PRN

## 2024-01-28 MED ORDER — FENTANYL-BUPIVACAINE-NACL 0.5-0.125-0.9 MG/250ML-% EP SOLN
12.0000 mL/h | EPIDURAL | Status: DC | PRN
  Administered 2024-01-28: 12 mL/h via EPIDURAL
  Filled 2024-01-28: qty 250

## 2024-01-28 MED ORDER — DIPHENHYDRAMINE HCL 25 MG PO CAPS: 25.0000 mg | ORAL_CAPSULE | Freq: Four times a day (QID) | ORAL | Status: DC | PRN

## 2024-01-28 MED ORDER — SIMETHICONE 80 MG PO CHEW: 80.0000 mg | CHEWABLE_TABLET | ORAL | Status: DC | PRN

## 2024-01-28 MED ORDER — DIBUCAINE (PERIANAL) 1 % EX OINT: 1.0000 | TOPICAL_OINTMENT | CUTANEOUS | Status: DC | PRN

## 2024-01-28 MED ORDER — ACETAMINOPHEN 325 MG PO TABS: 650.0000 mg | ORAL_TABLET | ORAL | Status: DC | PRN

## 2024-01-28 MED ORDER — OXYTOCIN-SODIUM CHLORIDE 30-0.9 UT/500ML-% IV SOLN
1.0000 m[IU]/min | INTRAVENOUS | Status: DC
  Administered 2024-01-28: 1 m[IU]/min via INTRAVENOUS
  Administered 2024-01-28: 2 m[IU]/min via INTRAVENOUS
  Filled 2024-01-28: qty 500

## 2024-01-28 MED ORDER — LACTATED RINGERS IV SOLN: 500.0000 mL | Freq: Once | INTRAVENOUS | Status: DC

## 2024-01-28 MED ORDER — HYDRALAZINE HCL 20 MG/ML IJ SOLN: 10.0000 mg | INTRAMUSCULAR | Status: DC | PRN

## 2024-01-28 MED ORDER — EPHEDRINE 5 MG/ML INJ
10.0000 mg | INTRAVENOUS | Status: DC | PRN
  Filled 2024-01-28: qty 5

## 2024-01-28 MED ORDER — DIPHENOXYLATE-ATROPINE 2.5-0.025 MG PO TABS
2.0000 | ORAL_TABLET | Freq: Once | ORAL | Status: AC
  Administered 2024-01-28: 2 via ORAL
  Filled 2024-01-28: qty 2

## 2024-01-28 MED ORDER — ONDANSETRON HCL 4 MG PO TABS: 4.0000 mg | ORAL_TABLET | ORAL | Status: DC | PRN

## 2024-01-28 MED ORDER — TERBUTALINE SULFATE 1 MG/ML IJ SOLN
INTRAMUSCULAR | Status: AC
  Administered 2024-01-28: 0.25 mg via SUBCUTANEOUS
  Filled 2024-01-28: qty 1

## 2024-01-28 MED ORDER — ZOLPIDEM TARTRATE 5 MG PO TABS: 5.0000 mg | ORAL_TABLET | Freq: Every evening | ORAL | Status: DC | PRN

## 2024-01-28 MED ORDER — SENNOSIDES-DOCUSATE SODIUM 8.6-50 MG PO TABS
2.0000 | ORAL_TABLET | Freq: Every day | ORAL | Status: DC
  Administered 2024-01-29: 2 via ORAL
  Filled 2024-01-28: qty 2

## 2024-01-28 MED ORDER — COCONUT OIL OIL: 1.0000 | TOPICAL_OIL | Status: DC | PRN

## 2024-01-28 MED ORDER — LACTATED RINGERS AMNIOINFUSION
INTRAVENOUS | Status: DC
Start: 2024-01-28 — End: 2024-01-28

## 2024-01-28 MED ORDER — TERBUTALINE SULFATE 1 MG/ML IJ SOLN: 0.2500 mg | Freq: Once | INTRAMUSCULAR | Status: AC

## 2024-01-28 MED ORDER — ONDANSETRON HCL 4 MG/2ML IJ SOLN
4.0000 mg | INTRAMUSCULAR | Status: DC | PRN
  Administered 2024-01-28: 4 mg via INTRAVENOUS
  Filled 2024-01-28: qty 2

## 2024-01-28 MED ORDER — EPHEDRINE 5 MG/ML INJ: 10.0000 mg | INTRAVENOUS | Status: DC | PRN

## 2024-01-28 MED ORDER — OXYCODONE HCL 5 MG PO TABS: 5.0000 mg | ORAL_TABLET | ORAL | Status: DC | PRN

## 2024-01-28 MED ORDER — TRANEXAMIC ACID-NACL 1000-0.7 MG/100ML-% IV SOLN: 1000.0000 mg | INTRAVENOUS | Status: DC

## 2024-01-28 MED ORDER — CARBOPROST TROMETHAMINE 250 MCG/ML IM SOLN
INTRAMUSCULAR | Status: AC
  Filled 2024-01-28: qty 1

## 2024-01-28 MED ORDER — CARBOPROST TROMETHAMINE 250 MCG/ML IM SOLN
250.0000 ug | Freq: Once | INTRAMUSCULAR | Status: AC
  Administered 2024-01-28: 250 ug via INTRAMUSCULAR

## 2024-01-28 MED ORDER — PHENYLEPHRINE 80 MCG/ML (10ML) SYRINGE FOR IV PUSH (FOR BLOOD PRESSURE SUPPORT)
80.0000 ug | PREFILLED_SYRINGE | INTRAVENOUS | Status: DC | PRN
  Filled 2024-01-28: qty 10

## 2024-01-28 MED ORDER — TETANUS-DIPHTH-ACELL PERTUSSIS 5-2.5-18.5 LF-MCG/0.5 IM SUSY: 0.5000 mL | PREFILLED_SYRINGE | Freq: Once | INTRAMUSCULAR | Status: DC

## 2024-01-28 MED ORDER — TRANEXAMIC ACID-NACL 1000-0.7 MG/100ML-% IV SOLN
INTRAVENOUS | Status: AC
  Administered 2024-01-28: 1000 mg
  Filled 2024-01-28: qty 100

## 2024-01-28 MED ORDER — OXYCODONE HCL 5 MG PO TABS: 10.0000 mg | ORAL_TABLET | ORAL | Status: DC | PRN

## 2024-01-28 MED ORDER — LIDOCAINE HCL (PF) 1 % IJ SOLN
INTRAMUSCULAR | Status: DC | PRN
  Administered 2024-01-28: 3 mL via EPIDURAL
  Administered 2024-01-28: 2 mL via EPIDURAL
  Administered 2024-01-28: 5 mL via EPIDURAL

## 2024-01-28 MED ORDER — IBUPROFEN 600 MG PO TABS
600.0000 mg | ORAL_TABLET | Freq: Four times a day (QID) | ORAL | Status: DC
  Administered 2024-01-28 – 2024-01-29 (×4): 600 mg via ORAL
  Filled 2024-01-28 (×5): qty 1

## 2024-01-28 MED ORDER — WITCH HAZEL-GLYCERIN EX PADS: 1.0000 | MEDICATED_PAD | CUTANEOUS | Status: DC | PRN

## 2024-01-28 MED ORDER — BENZOCAINE-MENTHOL 20-0.5 % EX AERO
1.0000 | INHALATION_SPRAY | CUTANEOUS | Status: DC | PRN
  Administered 2024-01-28: 1 via TOPICAL
  Filled 2024-01-28: qty 56

## 2024-01-28 MED ORDER — LABETALOL HCL 5 MG/ML IV SOLN: 20.0000 mg | INTRAVENOUS | Status: DC | PRN

## 2024-01-28 MED ORDER — LABETALOL HCL 5 MG/ML IV SOLN: 40.0000 mg | INTRAVENOUS | Status: DC | PRN

## 2024-01-28 MED ORDER — PHENYLEPHRINE 80 MCG/ML (10ML) SYRINGE FOR IV PUSH (FOR BLOOD PRESSURE SUPPORT): 80.0000 ug | PREFILLED_SYRINGE | INTRAVENOUS | Status: DC | PRN

## 2024-01-28 MED ORDER — LABETALOL HCL 5 MG/ML IV SOLN: 80.0000 mg | INTRAVENOUS | Status: DC | PRN

## 2024-01-28 MED ORDER — ZOLPIDEM TARTRATE 5 MG PO TABS
5.0000 mg | ORAL_TABLET | Freq: Every evening | ORAL | Status: DC | PRN
  Administered 2024-01-28: 5 mg via ORAL
  Filled 2024-01-28: qty 1

## 2024-01-28 NOTE — Progress Notes (Signed)
 OB Progress Note  S: Patient resting comfortably with epidural. No pressure at this time.  O: BP (!) 112/46   Pulse 88   Temp 98 F (36.7 C) (Axillary)   Resp 16   Ht 5\' 5"  (1.651 m)   Wt 110.5 kg   SpO2 98%   BMI 40.52 kg/m   FHT: 125bpm, minhimal to moderate variablity, + accels, - decels Contractions: q2-3 minutes SVE: 6.5/90/-1, sutures palpated  A/P: 28 y.o. G2P0010 @ [redacted]w[redacted]d admitted for induction of labor for gHTN.  FWB: Cat. II due to variability, accelerations present Labor course: S/p Cytotec  50mcg buccal concurrently with 25mcg vaginally, s/p AROM at 2245 (5/1), Pitocin  turned off (was only up to 30mU/min), Terbutaline  given at 0417 this AM due to tachysystole and MVUs of up to 275, currently MVUs ~120, will restart Pitocin  2x2 Pain: Epidural GBS: Positive - receiving PCN Anticipate SVD  Stacy Meras, DO

## 2024-01-28 NOTE — Lactation Note (Signed)
 This note was copied from a baby's chart. Lactation Consultation Note  Patient Name: Stacy Mayo [id number] Date: 01/28/2024 Age:31 hours Reason for consult: Initial assessment;Primapara;1st time breastfeeding;Term;Breastfeeding assistance  P1 - LC attempted to consult with parents of 7 hours old infant. Pt reports baby latched after birth and they tried a little while ago to offer breast, but infant was sleepy. Pt desires to breastfeed and desires for latching support. This LC attempted to support parents with breastfeeding after initial thought to place baby STS. It is now about 3.5 hours since last successful feeding. Attempted on the left breast, taught hand expression. Recommended FOB hold infant STS for 30 minutes, but is she begins to cue or show signs of interest prior to end of STS, to place to breast first and then supplement after.   Mom needs a full assessment and assist with breastfeeding this evening if possible. Will teach spoon feeding at next visit.   Maternal Data Has patient been taught Hand Expression?: Yes Does the patient have breastfeeding experience prior to this delivery?: No  Feeding Mother's Current Feeding Choice: Breast Milk Nipple Type: Slow - flow  LATCH Score   Attempted, but infant sleepy. Placed STS with FOB. Lactation to assist with latch this evening. Parents requested for Lactation to return tonight for latching and assessment.    Interventions Interventions: Breast feeding basics reviewed;Assisted with latch;Hand express;Education  Discharge Pump: Personal;Hands Free (MomCozy)  Consult Status Consult Status: Follow-up from L&D (Left status the same due to pt needing latch support at next feeding) Date: 01/28/24 Follow-up type: In-patient    Ardia Kraft 01/28/2024, 6:03 PM

## 2024-01-28 NOTE — Lactation Note (Signed)
 This note was copied from a baby's chart. Lactation Consultation Note  Patient Name: Stacy Mayo [id number] Date: 01/28/2024 Age:30 hours Reason for consult: Initial assessment;Primapara;Term Attempted to latch. Baby making a lot of noise at the breast. Noted baby suckling on her top lip and tongue thrusting nipple out. Suck training attempted but not helpful. Explained to mom that baby needs to learn to suck and it may take a few hrs to a few days. Baby will suck on a bottle fine and very hard. Mom wanted baby to get formula since baby couldn't latch. Mom asked for DEBP. LC set it up. Mom shown how to use DEBP & how to disassemble, clean, & reassemble parts. Mom encouraged to feed baby 8-12 times/24 hours and with feeding cues.  Newborn feeding habits, STS, I&O, positioning  for feeding, support reviewed. Encouraged mom to call for assistance as needed.  Maternal Data Has patient been taught Hand Expression?: Yes Does the patient have breastfeeding experience prior to this delivery?: No  Feeding Mother's Current Feeding Choice: Breast Milk and Formula  LATCH Score Latch: Repeated attempts needed to sustain latch, nipple held in mouth throughout feeding, stimulation needed to elicit sucking reflex.  Audible Swallowing: None  Type of Nipple: Everted at rest and after stimulation  Comfort (Breast/Nipple): Soft / non-tender  Hold (Positioning): Full assist, staff holds infant at breast  LATCH Score: 5   Lactation Tools Discussed/Used Tools: Pump;Flanges Flange Size: 21 Breast pump type: Double-Electric Breast Pump Pump Education: Setup, frequency, and cleaning;Milk Storage Reason for Pumping: mom wants to pump and bottle feed if she can't BF. explained baby needs time to learn to BF. Pumping frequency: q 3hr  Interventions Interventions: Breast feeding basics reviewed;Assisted with latch;Skin to skin;Breast massage;Hand express;Breast compression;Adjust position;Support  pillows;Position options;DEBP;Education;Pace feeding;LC Services brochure  Discharge    Consult Status Consult Status: Follow-up Date: 01/29/24 Follow-up type: In-patient    Stacy Mayo 01/28/2024, 11:52 PM

## 2024-01-28 NOTE — Progress Notes (Addendum)
 Stacy Mayo is a 28 y.o. G2P0010 at [redacted]w[redacted]d  Subjective: S/p epidural  Objective: BP (!) 120/52 (BP Location: Left Arm)   Pulse 73   Temp (!) 97.3 F (36.3 C) (Axillary)   Resp 16   Ht 5\' 5"  (1.651 m)   Wt 110.5 kg   BMI 40.52 kg/m  I/O last 3 completed shifts: In: 648.9 [P.O.:118; I.V.:280.9; IV Piggyback:250] Out: 200 [Urine:200] No intake/output data recorded.  FHT:  FHR: 140 bpm, variability: min-mod,  accelerations:   small accel,  decelerations:  Absent UC:   regular, every 3-4 minutes SVE:   Dilation: 3 Effacement (%): 80 Station: -1 Exam by:: Dr. Adelene Homer FSE (coming off) and IUPC (questionable placement) replaced Mod mec  Labs: Lab Results  Component Value Date   WBC 9.3 01/28/2024   HGB 9.9 (L) 01/28/2024   HCT 31.1 (L) 01/28/2024   MCV 84.5 01/28/2024   PLT 240 01/28/2024    Assessment / Plan: Induction of labor due to gestational hypertension  Labor:  progressing without  pitocin   with MVUs 275 Preeclampsia:  no signs or symptoms of toxicity Fetal Wellbeing:  Category II I discussed c-section for fetal indication and patient declined and wanted everything done first.  Fluids given, position changed, amnioinfusion started, terb given to get MVUs to 180-220 Pain Control:  Epidural I/D:   GBS positive on pcn Anticipated MOD:   unsure  Madelene Schanz, MD 01/28/2024, 4:40 AM  Addendum: tracing slightly improved but IV is infiltrated and BP low compared to her baseline which may be contributing to status of tracing.  RNs are having trouble replacing it and are calling the IV team.  Will reeval once fluids are running.

## 2024-01-28 NOTE — Lactation Note (Signed)
 This note was copied from a baby's chart. Lactation Consultation Note  Patient Name: Stacy Mayo [id number] Date: 01/28/2024 Age:30 hours Reason for consult: L&D Initial assessment;Follow-up assessment;Term;1st time breastfeeding  P1, Attempt was made to latch baby in both cradle and football hold.  Baby sleepy.  Suggest trying again when baby cues. Lactation to follow up on MBU.   Interventions Interventions: Assisted with latch;Skin to skin;Education Consult Status Consult Status: Follow-up from L&D  [name]  RN, IBCLC 01/28/2024, 11:28 AM

## 2024-01-28 NOTE — Anesthesia Postprocedure Evaluation (Signed)
 Anesthesia Post Note  Patient: Stacy Mayo  Procedure(s) Performed: AN AD HOC LABOR EPIDURAL     Patient location during evaluation: Mother Baby Anesthesia Type: Epidural Level of consciousness: awake and alert Pain management: pain level controlled Vital Signs Assessment: post-procedure vital signs reviewed and stable Respiratory status: spontaneous breathing, nonlabored ventilation and respiratory function stable Cardiovascular status: stable Postop Assessment: no headache, no backache and epidural receding Anesthetic complications: no   No notable events documented.  Last Vitals:  Vitals:   01/28/24 1350 01/28/24 1820  BP: 110/67 (!) 103/47  Pulse: 90 (!) 103  Resp: 16 16  Temp: 36.8 C 36.8 C  SpO2: 98% 100%    Last Pain:  Vitals:   01/28/24 1820  TempSrc: Oral  PainSc: 6    Pain Goal:                   [employer]

## 2024-01-28 NOTE — Progress Notes (Addendum)
 5/90/-1 by RN @ [phone] FHT 130, accels, no decels, min-mod variability (reviewed remotely by me) Toco q3-47min

## 2024-01-28 NOTE — Progress Notes (Signed)
 Stacy Mayo is a 28 y.o. G2P0010 at [redacted]w[redacted]d   Subjective: No complaints  Objective: BP 124/80   Pulse 95   Temp 98.9 F (37.2 C) (Oral)   Resp 16   Ht 5\' 5"  (1.651 m)   Wt 110.5 kg   BMI 40.52 kg/m  I/O last 3 completed shifts: In: 648.9 [P.O.:118; I.V.:280.9; IV Piggyback:250] Out: 200 [Urine:200] No intake/output data recorded.  FHT:  FHR: 135 bpm, variability: min-mod,  accelerations:  Present,  decelerations:  Absent UC:   regular, every 1 minute SVE:   Dilation: 1.5 Effacement (%): 70 Station: -3 Exam by:: Dr. Adelene Homer  Labs: Lab Results  Component Value Date   WBC 5.1 01/27/2024   HGB 9.5 (L) 01/27/2024   HCT 29.5 (L) 01/27/2024   MCV 84.0 01/27/2024   PLT 264 01/27/2024    Assessment / Plan: Induction of labor due to gestational hypertension,  progressing well on pitocin   Labor:  progressing well in her own early labor but with tachysystole.  Will give terb d/t remaining episodes of minimal variability on FHT. Preeclampsia:  no signs or symptoms of toxicity Fetal Wellbeing:   overall reassuring with cat 1 tracing and episodes of cat 2 hoping to improve by spacing contractions some with terb Pain Control:   upon request I/D:   GBS + on PCN Anticipated MOD:   hopeful for NSVD  Stacy Schanz, MD 01/28/2024, 12:26 AM

## 2024-01-28 NOTE — Anesthesia Procedure Notes (Signed)
 Epidural Patient location during procedure: OB Start time: 01/28/2024 3:33 AM End time: 01/28/2024 3:39 AM  Staffing Anesthesiologist: Peggy Bowens, MD Performed: anesthesiologist   Preanesthetic Checklist Completed: patient identified, IV checked, site marked, risks and benefits discussed, surgical consent, monitors and equipment checked, pre-op evaluation and timeout performed  Epidural Patient position: sitting Prep: DuraPrep and site prepped and draped Patient monitoring: continuous pulse ox and blood pressure Approach: midline Location: L3-L4 Injection technique: LOR air  Needle:  Needle type: Tuohy  Needle gauge: 17 G Needle length: 9 cm and 9 Needle insertion depth: 7 cm Catheter type: closed end flexible Catheter size: 19 Gauge Catheter at skin depth: 14 (12-->14 when sat upright) cm Test dose: negative  Assessment Events: blood not aspirated, no cerebrospinal fluid, injection not painful, no injection resistance, no paresthesia and negative IV test

## 2024-01-28 NOTE — Anesthesia Preprocedure Evaluation (Signed)
 Anesthesia Evaluation  Patient identified by MRN, date of birth, ID band Patient awake    Reviewed: Allergy & Precautions, Patient's Chart, lab work & pertinent test results  Airway Mallampati: II  TM Distance: >3 FB Neck ROM: Full    Dental   Pulmonary neg pulmonary ROS   breath sounds clear to auscultation       Cardiovascular hypertension,  Rhythm:Regular Rate:Normal     Neuro/Psych negative neurological ROS     GI/Hepatic negative GI ROS, Neg liver ROS,,,  Endo/Other  negative endocrine ROS    Renal/GU negative Renal ROS     Musculoskeletal   Abdominal   Peds  Hematology  (+) Blood dyscrasia, anemia   Anesthesia Other Findings   Reproductive/Obstetrics (+) Pregnancy                             Anesthesia Physical Anesthesia Plan  ASA: 3  Anesthesia Plan: Epidural   Post-op Pain Management:    Induction:   PONV Risk Score and Plan: 2  Airway Management Planned: Natural Airway  Additional Equipment:   Intra-op Plan:   Post-operative Plan:   Informed Consent: I have reviewed the patients History and Physical, chart, labs and discussed the procedure including the risks, benefits and alternatives for the proposed anesthesia with the patient or authorized representative who has indicated his/her understanding and acceptance.       Plan Discussed with:   Anesthesia Plan Comments:        Anesthesia Quick Evaluation

## 2024-01-29 LAB — CBC
HCT: 22.4 % — ABNORMAL LOW (ref 36.0–46.0)
Hemoglobin: 7.1 g/dL — ABNORMAL LOW (ref 12.0–15.0)
MCH: 27 pg (ref 26.0–34.0)
MCHC: 31.7 g/dL (ref 30.0–36.0)
MCV: 85.2 fL (ref 80.0–100.0)
Platelets: 246 10*3/uL (ref 150–400)
RBC: 2.63 MIL/uL — ABNORMAL LOW (ref 3.87–5.11)
RDW: 14.8 % (ref 11.5–15.5)
WBC: 13.2 10*3/uL — ABNORMAL HIGH (ref 4.0–10.5)
nRBC: 0 % (ref 0.0–0.2)

## 2024-01-29 MED ORDER — POLYSACCHARIDE IRON COMPLEX 150 MG PO CAPS
150.0000 mg | ORAL_CAPSULE | ORAL | Status: DC
  Administered 2024-01-29: 150 mg via ORAL
  Filled 2024-01-29: qty 1

## 2024-01-29 MED ORDER — POLYSACCHARIDE IRON COMPLEX 150 MG PO CAPS: 150.0000 mg | ORAL_CAPSULE | ORAL | 0 refills | Status: AC

## 2024-01-29 MED ORDER — IBUPROFEN 600 MG PO TABS: 600.0000 mg | ORAL_TABLET | Freq: Four times a day (QID) | ORAL | 0 refills | Status: AC

## 2024-01-29 NOTE — Discharge Summary (Signed)
 Postpartum Discharge Summary    Patient Name: Stacy Mayo DOB: 04-Dec-1995 MRN: [id number]  Date of admission: 01/27/2024 Delivery date:01/28/2024 Delivering provider: Meldon Sport Date of discharge: 01/29/2024  Admitting diagnosis: Preeclampsia, severe, third trimester [O14.13] Intrauterine pregnancy: [redacted]w[redacted]d     Secondary diagnosis:  Principal Problem:   Gestational hypertension Iron deficiency anemia.    Discharge diagnosis: Term pregnancy, delivered.                                                Post partum procedures: None. Augmentation: AROM, Pitocin , and Cytotec  Complications: None  Hospital course: Induction of Labor With Vaginal Delivery   28 y.o. yo G2P1011 at [redacted]w[redacted]d was admitted to the hospital 01/27/2024 for induction of labor.  Indication for induction:  Preeclampsia with severe featurres, then diagnosis was changed to gestational hypertension (headache resolved) .  Patient had a normal labor course.   Membrane Rupture Time/Date: 10:45 PM,01/27/2024  Delivery Method:Vaginal, Spontaneous Operative Delivery:N/A Episiotomy: None Lacerations:  2nd degree;Labial Details of delivery can be found in separate delivery note.  Patient had a normal postpartum course.  Patient is discharged home 01/29/24.  Her hemoglobin went from 9.9 to 7.1 and patient was asymptomatic.  She desired oral iron tablet use for this.   Newborn Data: Birth date:01/28/2024 Birth time:10:47 AM Gender:Female Living status:Living Apgars:6 ,9  Weight:3220 g  Immunizations received: There is no immunization history for the selected administration types on file for this patient.  Physical exam  Vitals:   01/28/24 1820 01/28/24 2239 01/29/24 0225 01/29/24 0512  BP: (!) 103/47 (!) 116/55 116/60 114/66  Pulse: (!) 103 92 96 90  Resp: 16 17 17 17   Temp: 98.2 F (36.8 C) 98.1 F (36.7 C) 98 F (36.7 C) 98.6 F (37 C)  TempSrc: Oral Oral Oral Oral  SpO2: 100% 99% 99% 99%  Weight:      Height:        General: alert, cooperative, and no distress Lochia: appropriate Uterine Fundus: firm Incision: N/A DVT Evaluation: No evidence of DVT seen on physical exam. No significant calf/ankle edema. Labs: Lab Results  Component Value Date   WBC 13.2 (H) 01/29/2024   HGB 7.1 (L) 01/29/2024   HCT 22.4 (L) 01/29/2024   MCV 85.2 01/29/2024   PLT 246 01/29/2024      Latest Ref Rng & Units 01/29/2024    5:36 AM 01/28/2024    2:39 AM 01/27/2024    3:53 PM  CBC  WBC 4.0 - 10.5 K/uL 13.2  9.3  5.1   Hemoglobin 12.0 - 15.0 g/dL 7.1  9.9  9.5   Hematocrit 36.0 - 46.0 % 22.4  31.1  29.5   Platelets 150 - 400 K/uL 246  240  264         Latest Ref Rng & Units 01/28/2024    2:39 AM  CMP  Glucose 70 - 99 mg/dL 92   BUN 6 - 20 mg/dL 5   Creatinine 1.47 - 8.29 mg/dL 5.62   Sodium 130 - 865 mmol/L 135   Potassium 3.5 - 5.1 mmol/L 3.2   Chloride 98 - 111 mmol/L 107   CO2 22 - 32 mmol/L 19   Calcium 8.9 - 10.3 mg/dL 8.7   Total Protein 6.5 - 8.1 g/dL 6.3   Total Bilirubin 0.0 - 1.2 mg/dL 0.6  Alkaline Phos 38 - 126 U/L 132   AST 15 - 41 U/L 23   ALT 0 - 44 U/L 12    Edinburgh Score:    01/29/2024    7:54 AM  Edinburgh Postnatal Depression Scale Screening Tool  I have been able to laugh and see the funny side of things. --   No data recorded  After visit meds:  Allergies as of 01/29/2024   No Known Allergies      Medication List     STOP taking these medications    metFORMIN 1000 MG tablet Commonly known as: GLUCOPHAGE       TAKE these medications    ibuprofen  600 MG tablet Commonly known as: ADVIL  Take 1 tablet (600 mg total) by mouth every 6 (six) hours.   iron polysaccharides 150 MG capsule Commonly known as: NIFEREX Take 1 capsule (150 mg total) by mouth every other day for 30 doses.   prenatal vitamin w/FE, FA 27-1 MG Tabs tablet Take 1 tablet by mouth daily at 12 noon.         Discharge home in stable condition Infant Feeding: Bottle and Breast Infant  Disposition:home with mother Discharge instruction: per After Visit Summary and Postpartum booklet. Activity: Advance as tolerated. Pelvic rest for 6 weeks.  Diet: routine diet Future Appointments:No future appointments. Follow up Visit:  Follow-up Information     Arlee Lace, MD. Schedule an appointment as soon as possible for a visit in 1 week(s).   Specialty: Obstetrics and Gynecology Why: 1 week for blood pressure check. 6 weeks for postpartum check. Contact information: [address] [place] [address] [phone]               Anticipated Birth Control:  Unsure.  01/29/2024 Charlott Converse, MD.

## 2024-01-29 NOTE — Discharge Instructions (Signed)
 Dara,  1. Do not do any heavy lifting, i.e nothing heavier than 15 lbs for the next 6 weeks.  2.  Do not use tampons or douche or take baths, do not have any sexual intercourse or anything inside the vagina for the next 6 weeks.  3. Take your pain medication as needed for pain, let us  know if the pain is not well controlled despite pain medication use.  4. Take your iron tablets for anemia.  You may also take a stool softener e.g colace if you are constipated.    5.  If you get a fever while at home, do check your temperature and if it is equal to or greater than 100.4 please call the office.   6. Some vaginal bleeding is expected and normal after your delivery. Please let us  know if if it excessive where you saturate 1 pad in less than 2 hours or so.  7. Please let us  know if with depression or anxiety symptoms, or symptoms of uncontrolled blood pressure such as headache, vision changes, nausea, vomiting, chest pain, shortness of breath.  OB/GYN provider.

## 2024-01-29 NOTE — Progress Notes (Signed)
 PPD# 1 SVD w/ 2nd degree;Labial  Information for the patient's newborn:  Venecia, Endress [[id number]]  female  Baby Name Lupe Salk:   Reports feeling good Tolerating PO fluid and solids No nausea or vomiting Bleeding is light Pain controlled with acetaminophen  and ibuprofen  (OTC) Up ad lib / ambulatory / voiding w/o difficulty Feeding: Breast    O:   VS: BP 114/66 (BP Location: Right Arm)   Pulse 90   Temp 98.6 F (37 C) (Oral)   Resp 17   Ht 5\' 5"  (1.651 m)   Wt 110.5 kg   SpO2 99%   Breastfeeding Unknown   BMI 40.52 kg/m   LABS:  Recent Labs    01/28/24 0239 01/29/24 0536  WBC 9.3 13.2*  HGB 9.9* 7.1*  PLT 240 246   Blood type: --/--/O POS (05/01 1553) Rubella: Immune (09/27 0000)                      I&O: Intake/Output      05/02 0701 05/03 0700 05/03 0701 05/04 0700   P.O.     I.V. (mL/kg) 0 (0)    Other 0    IV Piggyback     Total Intake(mL/kg) 0 (0)    Urine (mL/kg/hr) 0 (0)    Other 118    Stool 0    Blood 376    Total Output 494    Net -494         Urine Occurrence 1 x    Stool Occurrence 1 x      Physical Exam: Alert and oriented X3 Lungs: Clear and unlabored Heart: regular rate and rhythm / no mumurs Abdomen: soft, non-tender, non-distended  Fundus: firm, non-tender, U-2 Perineum: healing Lochia: appropriate Extremities: trace edema, negative for calf pain, tenderness, or cords    A:  PPD # 1  Normal exam GHTN    -normotensive   P:  Routine postpartum orders Lactation support PRN Contraception: natural family planning Anticipate D/C on 01/30/24 Plan reviewed w/ Dr. Theressa Flatness, DNP, CNM 01/29/2024, 7:37 AM

## 2024-01-29 NOTE — Lactation Note (Signed)
 This note was copied from a baby's chart. Lactation Consultation Note  Patient Name: Stacy Mayo'G Date: 01/29/2024 Age:30 hours Reason for consult: Follow-up assessment;Primapara;1st time breastfeeding;Term;Breastfeeding assistance  P1 : Lactation returned to room to assist with breastfeeding. Barney Boozer was able to latch independently to the left breast without concerns. Offered rhythmic sucks and swallows. While infant fed, LC reviewed breastfeeding basics including expectations for infant I&O, feeding frequency over next few days (cluster feeding), maternal milk production, engorgement, outpatient resources and more.   Tinsley fed for 15 minutes - signs of contentment shown. Discussed when to supplement and pump. At this time, infant satiety present, no supplementation needed. Discussed that now that Barney Boozer is over 24 hours she may start to show more interest in feeding and frequency may not always be right at 3 hours between feedings. Praised mom for great successful since the initial lactation visit. Parents thankful for the support.   Maternal Data    Feeding Mother's Current Feeding Choice: Breast Milk and Formula Nipple Type: Slow - flow  LATCH Score Latch: Grasps breast easily, tongue down, lips flanged, rhythmical sucking.  Audible Swallowing: Spontaneous and intermittent  Type of Nipple: Everted at rest and after stimulation  Comfort (Breast/Nipple): Soft / non-tender  Hold (Positioning): No assistance needed to correctly position infant at breast.  LATCH Score: 10   Lactation Tools Discussed/Used    Interventions Interventions: Breast feeding basics reviewed;Hand express;Education  Discharge Discharge Education: Engorgement and breast care;Warning signs for feeding baby;Outpatient recommendation Pump: Personal;Hands Free (Mom Cozy)  Consult Status Consult Status: Follow-up Date: 01/30/24 Follow-up type: In-patient    Ardia Kraft 01/29/2024,  12:36 PM

## 2024-01-29 NOTE — Progress Notes (Signed)
 Per Caridad Chard, MD, gave patient the option between PO iron and IV iron. Patient declined IV iron, agreed to PO iron. MD notified.   Sailor Hevia, RN 01/29/2024 1:58 PM

## 2024-01-29 NOTE — Lactation Note (Signed)
 This note was copied from a baby's chart. Lactation Consultation Note  Patient Name: Stacy Mayo [id number] Date: 01/29/2024 Age:30 hours Reason for consult: Follow-up assessment;Primapara;1st time breastfeeding  P1 - follow up. Gathered feeding progress. Parents report infant has been going to breast but is also now being supplemented with formula. Pt was encouraged to pump after supplementation to build and protect milk supply. Encouraged use of Symphony electric pump. Created feeding plan of: offering the breast, supplement as needed, pump and feed back or store EBM.   Parents request LC to return at next feeding. Will follow up arounf 12pm  Maternal Data    Feeding Mother's Current Feeding Choice: Breast Milk and Formula    Consult Status Consult Status: Follow-up (LC to return at next feeding for breastfeeding support) Date: 01/29/24 Follow-up type: In-patient    Ardia Kraft 01/29/2024, 10:18 AM

## 2024-01-30 ENCOUNTER — Inpatient Hospital Stay (HOSPITAL_COMMUNITY)

## 2024-02-01 LAB — SURGICAL PATHOLOGY

## 2024-02-14 ENCOUNTER — Telehealth (HOSPITAL_COMMUNITY): Payer: Self-pay | Admitting: *Deleted

## 2024-02-14 NOTE — Telephone Encounter (Signed)
 Attempted hospital discharge follow-up call. Left message for patient to return RN call with any questions or concerns. Julien Odor, RN, 02/14/24, [phone]
